# Patient Record
Sex: Male | Born: 1935 | Race: White | Hispanic: No | Marital: Married | Smoking: Never smoker
Health system: Southern US, Community
[De-identification: ages and names within clinical notes are randomized; demographics above are authoritative.]

## PROBLEM LIST (undated history)

## (undated) DIAGNOSIS — N400 Enlarged prostate without lower urinary tract symptoms: Secondary | ICD-10-CM

## (undated) DIAGNOSIS — M1990 Unspecified osteoarthritis, unspecified site: Secondary | ICD-10-CM

## (undated) DIAGNOSIS — K219 Gastro-esophageal reflux disease without esophagitis: Secondary | ICD-10-CM

## (undated) DIAGNOSIS — K5900 Constipation, unspecified: Secondary | ICD-10-CM

## (undated) DIAGNOSIS — F329 Major depressive disorder, single episode, unspecified: Secondary | ICD-10-CM

## (undated) DIAGNOSIS — F32A Depression, unspecified: Secondary | ICD-10-CM

## (undated) DIAGNOSIS — I10 Essential (primary) hypertension: Secondary | ICD-10-CM

## (undated) DIAGNOSIS — Z9109 Other allergy status, other than to drugs and biological substances: Secondary | ICD-10-CM

## (undated) HISTORY — PX: DENTAL SURGERY: SHX609

## (undated) HISTORY — PX: BACK SURGERY: SHX140

## (undated) HISTORY — PX: COLONOSCOPY: SHX174

---

## 2012-07-08 ENCOUNTER — Other Ambulatory Visit: Payer: Self-pay | Admitting: Neurosurgery

## 2012-07-08 DIAGNOSIS — M47817 Spondylosis without myelopathy or radiculopathy, lumbosacral region: Secondary | ICD-10-CM

## 2012-07-08 DIAGNOSIS — M545 Low back pain: Secondary | ICD-10-CM

## 2012-07-08 DIAGNOSIS — M4310 Spondylolisthesis, site unspecified: Secondary | ICD-10-CM

## 2012-07-08 DIAGNOSIS — M5137 Other intervertebral disc degeneration, lumbosacral region: Secondary | ICD-10-CM

## 2012-07-10 ENCOUNTER — Ambulatory Visit
Admission: RE | Admit: 2012-07-10 | Discharge: 2012-07-10 | Disposition: A | Discharge status: Home / Self Care | Payer: Medicare Other | Source: Ambulatory Visit | Attending: Neurosurgery | Admitting: Neurosurgery

## 2012-07-10 DIAGNOSIS — M545 Low back pain: Secondary | ICD-10-CM

## 2012-07-10 DIAGNOSIS — M5137 Other intervertebral disc degeneration, lumbosacral region: Secondary | ICD-10-CM

## 2012-07-10 DIAGNOSIS — M4310 Spondylolisthesis, site unspecified: Secondary | ICD-10-CM

## 2012-07-10 DIAGNOSIS — M47817 Spondylosis without myelopathy or radiculopathy, lumbosacral region: Secondary | ICD-10-CM

## 2012-07-23 ENCOUNTER — Other Ambulatory Visit: Payer: Self-pay | Admitting: Neurosurgery

## 2012-08-24 ENCOUNTER — Encounter (HOSPITAL_COMMUNITY): Payer: Self-pay | Admitting: Pharmacy Technician

## 2012-08-26 ENCOUNTER — Encounter (HOSPITAL_COMMUNITY)
Admission: RE | Admit: 2012-08-26 | Discharge: 2012-08-26 | Disposition: A | Discharge status: Home / Self Care | Payer: Medicare Other | Source: Ambulatory Visit | Attending: Anesthesiology | Admitting: Anesthesiology

## 2012-08-26 ENCOUNTER — Encounter (HOSPITAL_COMMUNITY)
Admission: RE | Admit: 2012-08-26 | Discharge: 2012-08-26 | Disposition: A | Discharge status: Home / Self Care | Payer: Medicare Other | Source: Ambulatory Visit | Attending: Neurosurgery | Admitting: Neurosurgery

## 2012-08-26 ENCOUNTER — Encounter (HOSPITAL_COMMUNITY): Payer: Self-pay

## 2012-08-26 HISTORY — DX: Depression, unspecified: F32.A

## 2012-08-26 HISTORY — DX: Unspecified osteoarthritis, unspecified site: M19.90

## 2012-08-26 HISTORY — DX: Essential (primary) hypertension: I10

## 2012-08-26 HISTORY — DX: Gastro-esophageal reflux disease without esophagitis: K21.9

## 2012-08-26 HISTORY — DX: Major depressive disorder, single episode, unspecified: F32.9

## 2012-08-26 LAB — CBC
HCT: 41.9 % (ref 39.0–52.0)
Hemoglobin: 13.6 g/dL (ref 13.0–17.0)
MCH: 29.8 pg (ref 26.0–34.0)
MCV: 91.7 fL (ref 78.0–100.0)
Platelets: 232 10*3/uL (ref 150–400)
RBC: 4.57 MIL/uL (ref 4.22–5.81)
WBC: 5.9 10*3/uL (ref 4.0–10.5)

## 2012-08-26 LAB — TYPE AND SCREEN
ABO/RH(D): O NEG
Antibody Screen: NEGATIVE

## 2012-08-26 LAB — BASIC METABOLIC PANEL
CO2: 27 mEq/L (ref 19–32)
Calcium: 9.4 mg/dL (ref 8.4–10.5)
Chloride: 102 mEq/L (ref 96–112)
Glucose, Bld: 91 mg/dL (ref 70–99)
Sodium: 139 mEq/L (ref 135–145)

## 2012-08-26 LAB — SURGICAL PCR SCREEN
MRSA, PCR: NEGATIVE
Staphylococcus aureus: NEGATIVE

## 2012-08-26 LAB — ABO/RH: ABO/RH(D): O NEG

## 2012-08-26 NOTE — Progress Notes (Signed)
08/26/12 1456  OBSTRUCTIVE SLEEP APNEA  Have you ever been diagnosed with sleep apnea through a sleep study? No  Do you snore loudly (loud enough to be heard through closed doors)?  1  Do you often feel tired, fatigued, or sleepy during the daytime? 0  Has anyone observed you stop breathing during your sleep? 0  Do you have, or are you being treated for high blood pressure? 1  BMI more than 35 kg/m2? 0  Age over 75 years old? 1  Neck circumference greater than 40 cm/18 inches? 0  Gender: 1  Obstructive Sleep Apnea Score 4   Score 4 or greater  Results sent to PCP

## 2012-08-26 NOTE — Pre-Procedure Instructions (Signed)
22 Calvin Marks  08/26/2012   Your procedure is scheduled on:  Wednesday September 02, 2012  Report to [facility] at 6:30 AM.  Call this number if you have problems the morning of surgery: [phone]   Remember:   Do not eat food or drinkAfter Midnight.    Take these medicines the morning of surgery with A SIP OF WATER: chlortimeton, finasteride, hydrocodone,metoprolol, omeprazole,     Do not wear jewelry, make-up or nail polish.  Do not wear lotions, powders, or perfumes.   Do not shave 48 hours prior to surgery. Men may shave face and neck.  Do not bring valuables to the hospital.  Contacts, dentures or bridgework may not be worn into surgery.  Leave suitcase in the car. After surgery it may be brought to your room.  For patients admitted to the hospital, checkout time is 11:00 AM the day of discharge.   Patients discharged the day of surgery will not be allowed to drive home.  Name and phone number of your driver: family / friend  Special Instructions: Shower using CHG 2 nights before surgery and the night before surgery.  If you shower the day of surgery use CHG.  Use special wash - you have one bottle of CHG for all showers.  You should use approximately 1/3 of the bottle for each shower.   Please read over the following fact sheets that you were given: Pain Booklet, Coughing and Deep Breathing, Blood Transfusion Information, MRSA Information and Surgical Site Infection Prevention

## 2012-08-26 NOTE — Progress Notes (Signed)
Primary Physician -- Dr. Kirby Funk  Does not have a cardiologist No recent cardiac testing

## 2012-08-28 NOTE — Consult Note (Addendum)
Anesthesia Chart Review:  Patient is a 77 year old male scheduled for L2-4 laminectomy with L2-3 and L3-4 PLIF by Dr. Newell Coral on 09/02/12.  History includes non-smoker, HTN, GERD, depression, arthritis.  PCP is listed as Dr. Kirby Funk.  EKG on 08/26/12 showed NSR, cannot rule out anterior infarct (age undetermined).  Currently, there are no comparison EKGs available. No CV symptoms documented at PAT.   CXR on 08/26/12 showed no active disease, degenerative changes of the thoracic spine.  Preoperative labs noted.  I've asked Short Stay staff to request additional records, EKG as available from Dr. Valentina Lucks.  I'll review additional records if received, otherwise clinical correlation on the day of surgery. He has no reported history of CAD/MI/CHF or diabetes mellitus, so would anticipate that if he remains asymptomatic from a CV standpoint then he could proceed as planned. (Update: Only office note available from PCP.  No EKG or prior cardiac studies.)  Shonna Chock, PA-C 09/17/2012 1730

## 2012-09-01 MED ORDER — CEFAZOLIN SODIUM-DEXTROSE 2-3 GM-% IV SOLR
2.0000 g | INTRAVENOUS | Status: AC
  Administered 2012-09-02 (×2): 2 g via INTRAVENOUS
  Filled 2012-09-01: qty 50

## 2012-09-02 ENCOUNTER — Inpatient Hospital Stay (HOSPITAL_COMMUNITY)
Admission: RE | Admit: 2012-09-02 | Discharge: 2012-09-04 | DRG: 460 | Disposition: A | Discharge status: Home / Self Care | Payer: Medicare Other | Source: Ambulatory Visit | Attending: Neurosurgery | Admitting: Neurosurgery

## 2012-09-02 ENCOUNTER — Inpatient Hospital Stay (HOSPITAL_COMMUNITY): Payer: Medicare Other

## 2012-09-02 ENCOUNTER — Encounter (HOSPITAL_COMMUNITY): Payer: Self-pay | Admitting: Vascular Surgery

## 2012-09-02 ENCOUNTER — Encounter (HOSPITAL_COMMUNITY)
Admission: RE | Disposition: A | Discharge status: Home / Self Care | Payer: Self-pay | Source: Ambulatory Visit | Attending: Neurosurgery

## 2012-09-02 ENCOUNTER — Inpatient Hospital Stay (HOSPITAL_COMMUNITY): Payer: Medicare Other | Admitting: Vascular Surgery

## 2012-09-02 ENCOUNTER — Encounter (HOSPITAL_COMMUNITY): Payer: Self-pay | Admitting: *Deleted

## 2012-09-02 DIAGNOSIS — Z79899 Other long term (current) drug therapy: Secondary | ICD-10-CM

## 2012-09-02 DIAGNOSIS — N400 Enlarged prostate without lower urinary tract symptoms: Secondary | ICD-10-CM | POA: Diagnosis present

## 2012-09-02 DIAGNOSIS — M51379 Other intervertebral disc degeneration, lumbosacral region without mention of lumbar back pain or lower extremity pain: Principal | ICD-10-CM | POA: Diagnosis present

## 2012-09-02 DIAGNOSIS — M5137 Other intervertebral disc degeneration, lumbosacral region: Principal | ICD-10-CM | POA: Diagnosis present

## 2012-09-02 DIAGNOSIS — I10 Essential (primary) hypertension: Secondary | ICD-10-CM | POA: Diagnosis present

## 2012-09-02 DIAGNOSIS — K219 Gastro-esophageal reflux disease without esophagitis: Secondary | ICD-10-CM | POA: Diagnosis present

## 2012-09-02 SURGERY — POSTERIOR LUMBAR FUSION 2 LEVEL
Anesthesia: General | Site: Back | Wound class: Clean

## 2012-09-02 MED ORDER — MEPERIDINE HCL 25 MG/ML IJ SOLN: 6.2500 mg | INTRAMUSCULAR | Status: DC | PRN

## 2012-09-02 MED ORDER — MIDAZOLAM HCL 5 MG/5ML IJ SOLN
INTRAMUSCULAR | Status: DC | PRN
  Administered 2012-09-02: 1 mg via INTRAVENOUS

## 2012-09-02 MED ORDER — PROMETHAZINE HCL 25 MG/ML IJ SOLN: 6.2500 mg | INTRAMUSCULAR | Status: DC | PRN

## 2012-09-02 MED ORDER — KETOROLAC TROMETHAMINE 30 MG/ML IJ SOLN: 30.0000 mg | Freq: Once | INTRAMUSCULAR | Status: DC

## 2012-09-02 MED ORDER — LIDOCAINE HCL (CARDIAC) 20 MG/ML IV SOLN
INTRAVENOUS | Status: DC | PRN
  Administered 2012-09-02: 100 mg via INTRAVENOUS

## 2012-09-02 MED ORDER — HYDROXYZINE HCL 25 MG PO TABS: 50.0000 mg | ORAL_TABLET | ORAL | Status: DC | PRN

## 2012-09-02 MED ORDER — LACTATED RINGERS IV SOLN
INTRAVENOUS | Status: DC | PRN
  Administered 2012-09-02 (×4): via INTRAVENOUS

## 2012-09-02 MED ORDER — BUPIVACAINE HCL (PF) 0.5 % IJ SOLN
INTRAMUSCULAR | Status: DC | PRN
  Administered 2012-09-02: 16.5 mL

## 2012-09-02 MED ORDER — HYDROMORPHONE HCL PF 1 MG/ML IJ SOLN
INTRAMUSCULAR | Status: AC
  Filled 2012-09-02: qty 1

## 2012-09-02 MED ORDER — THROMBIN 5000 UNITS EX SOLR
OROMUCOSAL | Status: DC | PRN
  Administered 2012-09-02: 13:00:00 via TOPICAL

## 2012-09-02 MED ORDER — ALUM & MAG HYDROXIDE-SIMETH 200-200-20 MG/5ML PO SUSP
30.0000 mL | Freq: Four times a day (QID) | ORAL | Status: DC | PRN
  Administered 2012-09-03: 30 mL via ORAL
  Filled 2012-09-02: qty 30

## 2012-09-02 MED ORDER — KCL IN DEXTROSE-NACL 20-5-0.45 MEQ/L-%-% IV SOLN
INTRAVENOUS | Status: AC
  Administered 2012-09-02: 1000 mL
  Filled 2012-09-02: qty 1000

## 2012-09-02 MED ORDER — THROMBIN 20000 UNITS EX KIT
PACK | CUTANEOUS | Status: DC | PRN
  Administered 2012-09-02 (×2): 20000 [IU] via TOPICAL

## 2012-09-02 MED ORDER — SODIUM CHLORIDE 0.9 % IV SOLN
INTRAVENOUS | Status: DC | PRN
  Administered 2012-09-02: 12:00:00 via INTRAVENOUS

## 2012-09-02 MED ORDER — OXYCODONE HCL 5 MG PO TABS
5.0000 mg | ORAL_TABLET | ORAL | Status: DC | PRN
  Administered 2012-09-02 – 2012-09-04 (×4): 10 mg via ORAL
  Filled 2012-09-02 (×5): qty 2

## 2012-09-02 MED ORDER — GLYCOPYRROLATE 0.2 MG/ML IJ SOLN
INTRAMUSCULAR | Status: DC | PRN
  Administered 2012-09-02: .8 mg via INTRAVENOUS

## 2012-09-02 MED ORDER — ALBUMIN HUMAN 5 % IV SOLN
INTRAVENOUS | Status: DC | PRN
  Administered 2012-09-02 (×2): via INTRAVENOUS

## 2012-09-02 MED ORDER — SODIUM CHLORIDE 0.9 % IJ SOLN
3.0000 mL | Freq: Two times a day (BID) | INTRAMUSCULAR | Status: DC
  Administered 2012-09-02 – 2012-09-03 (×3): 3 mL via INTRAVENOUS

## 2012-09-02 MED ORDER — FINASTERIDE 5 MG PO TABS
5.0000 mg | ORAL_TABLET | Freq: Every morning | ORAL | Status: DC
  Administered 2012-09-03 – 2012-09-04 (×2): 5 mg via ORAL
  Filled 2012-09-02 (×3): qty 1

## 2012-09-02 MED ORDER — DIAZEPAM 5 MG PO TABS
10.0000 mg | ORAL_TABLET | Freq: Every day | ORAL | Status: DC
  Administered 2012-09-02 – 2012-09-03 (×2): 10 mg via ORAL
  Filled 2012-09-02 (×2): qty 2

## 2012-09-02 MED ORDER — TRIAMTERENE-HCTZ 37.5-25 MG PO TABS
1.0000 | ORAL_TABLET | Freq: Every morning | ORAL | Status: DC
  Administered 2012-09-03 – 2012-09-04 (×2): 1 via ORAL
  Filled 2012-09-02 (×3): qty 1

## 2012-09-02 MED ORDER — PROPOFOL 10 MG/ML IV BOLUS
INTRAVENOUS | Status: DC | PRN
  Administered 2012-09-02: 160 mg via INTRAVENOUS

## 2012-09-02 MED ORDER — BACITRACIN 50000 UNITS IM SOLR
INTRAMUSCULAR | Status: AC
  Filled 2012-09-02: qty 1

## 2012-09-02 MED ORDER — ARTIFICIAL TEARS OP OINT
TOPICAL_OINTMENT | OPHTHALMIC | Status: DC | PRN
  Administered 2012-09-02: 1 via OPHTHALMIC

## 2012-09-02 MED ORDER — OXYCODONE HCL 5 MG/5ML PO SOLN: 5.0000 mg | Freq: Once | ORAL | Status: DC | PRN

## 2012-09-02 MED ORDER — MENTHOL 3 MG MT LOZG
1.0000 | LOZENGE | OROMUCOSAL | Status: DC | PRN
  Filled 2012-09-02: qty 9

## 2012-09-02 MED ORDER — VECURONIUM BROMIDE 10 MG IV SOLR
INTRAVENOUS | Status: DC | PRN
  Administered 2012-09-02: 1 mg via INTRAVENOUS
  Administered 2012-09-02 (×3): 2 mg via INTRAVENOUS
  Administered 2012-09-02: 1 mg via INTRAVENOUS

## 2012-09-02 MED ORDER — PHENOL 1.4 % MT LIQD: 1.0000 | OROMUCOSAL | Status: DC | PRN

## 2012-09-02 MED ORDER — MORPHINE SULFATE 4 MG/ML IJ SOLN
4.0000 mg | INTRAMUSCULAR | Status: DC | PRN
  Administered 2012-09-02: 4 mg via INTRAMUSCULAR
  Filled 2012-09-02: qty 1

## 2012-09-02 MED ORDER — OXYCODONE HCL 5 MG PO TABS: 5.0000 mg | ORAL_TABLET | Freq: Once | ORAL | Status: DC | PRN

## 2012-09-02 MED ORDER — ACETAMINOPHEN 325 MG PO TABS
650.0000 mg | ORAL_TABLET | ORAL | Status: DC | PRN
  Administered 2012-09-04 (×2): 650 mg via ORAL
  Filled 2012-09-02 (×2): qty 2

## 2012-09-02 MED ORDER — ACETAMINOPHEN 650 MG RE SUPP: 650.0000 mg | RECTAL | Status: DC | PRN

## 2012-09-02 MED ORDER — LIDOCAINE-EPINEPHRINE 1 %-1:100000 IJ SOLN
INTRAMUSCULAR | Status: DC | PRN
  Administered 2012-09-02: 16.5 mL via INTRADERMAL

## 2012-09-02 MED ORDER — KETOROLAC TROMETHAMINE 30 MG/ML IJ SOLN
INTRAMUSCULAR | Status: AC
  Administered 2012-09-02: 30 mg
  Filled 2012-09-02: qty 1

## 2012-09-02 MED ORDER — BISACODYL 10 MG RE SUPP: 10.0000 mg | Freq: Every day | RECTAL | Status: DC | PRN

## 2012-09-02 MED ORDER — CEFAZOLIN SODIUM-DEXTROSE 2-3 GM-% IV SOLR
INTRAVENOUS | Status: AC
  Filled 2012-09-02: qty 50

## 2012-09-02 MED ORDER — ACETAMINOPHEN 10 MG/ML IV SOLN
1000.0000 mg | Freq: Four times a day (QID) | INTRAVENOUS | Status: AC
  Administered 2012-09-02 – 2012-09-03 (×4): 1000 mg via INTRAVENOUS
  Filled 2012-09-02 (×4): qty 100

## 2012-09-02 MED ORDER — POLYETHYLENE GLYCOL 3350 17 G PO PACK
9.0000 g | PACK | Freq: Every morning | ORAL | Status: DC
  Administered 2012-09-03 – 2012-09-04 (×2): 9 g via ORAL
  Filled 2012-09-02 (×3): qty 1

## 2012-09-02 MED ORDER — 0.9 % SODIUM CHLORIDE (POUR BTL) OPTIME
TOPICAL | Status: DC | PRN
  Administered 2012-09-02 (×2): 1000 mL

## 2012-09-02 MED ORDER — ACETAMINOPHEN 10 MG/ML IV SOLN
INTRAVENOUS | Status: AC
  Administered 2012-09-02: 1000 mg via INTRAVENOUS
  Filled 2012-09-02: qty 100

## 2012-09-02 MED ORDER — SODIUM CHLORIDE 0.9 % IJ SOLN: 3.0000 mL | INTRAMUSCULAR | Status: DC | PRN

## 2012-09-02 MED ORDER — ROCURONIUM BROMIDE 100 MG/10ML IV SOLN
INTRAVENOUS | Status: DC | PRN
  Administered 2012-09-02: 50 mg via INTRAVENOUS

## 2012-09-02 MED ORDER — ONDANSETRON HCL 4 MG/2ML IJ SOLN
INTRAMUSCULAR | Status: DC | PRN
  Administered 2012-09-02: 4 mg via INTRAVENOUS

## 2012-09-02 MED ORDER — HYDROMORPHONE HCL PF 1 MG/ML IJ SOLN
0.2500 mg | INTRAMUSCULAR | Status: DC | PRN
  Administered 2012-09-02 (×3): 0.5 mg via INTRAVENOUS

## 2012-09-02 MED ORDER — SODIUM CHLORIDE 0.9 % IR SOLN
Status: DC | PRN
  Administered 2012-09-02 (×2)

## 2012-09-02 MED ORDER — KCL IN DEXTROSE-NACL 20-5-0.45 MEQ/L-%-% IV SOLN
INTRAVENOUS | Status: DC
  Filled 2012-09-02 (×6): qty 1000

## 2012-09-02 MED ORDER — SODIUM CHLORIDE 0.9 % IV SOLN
INTRAVENOUS | Status: AC
  Filled 2012-09-02: qty 500

## 2012-09-02 MED ORDER — MAGNESIUM HYDROXIDE 400 MG/5ML PO SUSP: 30.0000 mL | Freq: Every day | ORAL | Status: DC | PRN

## 2012-09-02 MED ORDER — SIMVASTATIN 20 MG PO TABS
20.0000 mg | ORAL_TABLET | Freq: Every day | ORAL | Status: DC
  Administered 2012-09-02 – 2012-09-03 (×2): 20 mg via ORAL
  Filled 2012-09-02 (×3): qty 1

## 2012-09-02 MED ORDER — EPHEDRINE SULFATE 50 MG/ML IJ SOLN
INTRAMUSCULAR | Status: DC | PRN
  Administered 2012-09-02 (×2): 10 mg via INTRAVENOUS

## 2012-09-02 MED ORDER — HEMOSTATIC AGENTS (NO CHARGE) OPTIME
TOPICAL | Status: DC | PRN
  Administered 2012-09-02 (×2): 1 via TOPICAL

## 2012-09-02 MED ORDER — KETOROLAC TROMETHAMINE 30 MG/ML IJ SOLN
30.0000 mg | Freq: Four times a day (QID) | INTRAMUSCULAR | Status: DC
  Administered 2012-09-02 – 2012-09-04 (×7): 30 mg via INTRAVENOUS
  Filled 2012-09-02 (×10): qty 1

## 2012-09-02 MED ORDER — SODIUM CHLORIDE 0.9 % IV SOLN: 250.0000 mL | INTRAVENOUS | Status: DC

## 2012-09-02 MED ORDER — FENTANYL CITRATE 0.05 MG/ML IJ SOLN
INTRAMUSCULAR | Status: DC | PRN
  Administered 2012-09-02: 50 ug via INTRAVENOUS
  Administered 2012-09-02: 100 ug via INTRAVENOUS
  Administered 2012-09-02 (×3): 50 ug via INTRAVENOUS

## 2012-09-02 MED ORDER — METOPROLOL TARTRATE 25 MG PO TABS
25.0000 mg | ORAL_TABLET | Freq: Every morning | ORAL | Status: DC
  Administered 2012-09-03 – 2012-09-04 (×2): 25 mg via ORAL
  Filled 2012-09-02 (×3): qty 1

## 2012-09-02 MED ORDER — PANTOPRAZOLE SODIUM 40 MG PO TBEC
40.0000 mg | DELAYED_RELEASE_TABLET | Freq: Every day | ORAL | Status: DC
  Administered 2012-09-03 – 2012-09-04 (×2): 40 mg via ORAL
  Filled 2012-09-02 (×2): qty 1

## 2012-09-02 MED ORDER — CYCLOBENZAPRINE HCL 10 MG PO TABS
10.0000 mg | ORAL_TABLET | Freq: Three times a day (TID) | ORAL | Status: DC | PRN
  Administered 2012-09-02 – 2012-09-03 (×2): 10 mg via ORAL
  Filled 2012-09-02 (×2): qty 1

## 2012-09-02 MED ORDER — NEOSTIGMINE METHYLSULFATE 1 MG/ML IJ SOLN
INTRAMUSCULAR | Status: DC | PRN
  Administered 2012-09-02: 5 mg via INTRAVENOUS

## 2012-09-02 MED ORDER — HYDROXYZINE HCL 50 MG/ML IM SOLN: 50.0000 mg | INTRAMUSCULAR | Status: DC | PRN

## 2012-09-02 SURGICAL SUPPLY — 79 items
BAG DECANTER FOR FLEXI CONT (MISCELLANEOUS) ×2 IMPLANT
BENZOIN TINCTURE PRP APPL 2/3 (GAUZE/BANDAGES/DRESSINGS) IMPLANT
BLADE SURG ROTATE 9660 (MISCELLANEOUS) IMPLANT
BRUSH SCRUB EZ PLAIN DRY (MISCELLANEOUS) ×2 IMPLANT
BUR ACORN 6.0 ACORN (BURR) IMPLANT
BUR ACRON 5.0MM COATED (BURR) ×4 IMPLANT
BUR MATCHSTICK NEURO 3.0 LAGG (BURR) ×2 IMPLANT
CANISTER SUCTION 2500CC (MISCELLANEOUS) ×2 IMPLANT
CAP LCK SPNE (Orthopedic Implant) ×6 IMPLANT
CAP LOCK SPINE RADIUS (Orthopedic Implant) ×6 IMPLANT
CAP LOCKING (Orthopedic Implant) ×6 IMPLANT
CATH COUDE FOLEY 2W 5CC 16FR (CATHETERS) ×2 IMPLANT
CLOTH BEACON ORANGE TIMEOUT ST (SAFETY) ×2 IMPLANT
CONT SPEC 4OZ CLIKSEAL STRL BL (MISCELLANEOUS) ×4 IMPLANT
COVER BACK TABLE 24X17X13 BIG (DRAPES) IMPLANT
COVER TABLE BACK 60X90 (DRAPES) ×2 IMPLANT
CROSSLINK MEDIUM (Orthopedic Implant) ×2 IMPLANT
DERMABOND ADVANCED (GAUZE/BANDAGES/DRESSINGS) ×1
DERMABOND ADVANCED .7 DNX12 (GAUZE/BANDAGES/DRESSINGS) ×1 IMPLANT
DRAPE C-ARM 42X72 X-RAY (DRAPES) ×4 IMPLANT
DRAPE LAPAROTOMY 100X72X124 (DRAPES) ×2 IMPLANT
DRAPE POUCH INSTRU U-SHP 10X18 (DRAPES) ×2 IMPLANT
DRAPE PROXIMA HALF (DRAPES) ×2 IMPLANT
DRSG EMULSION OIL 3X3 NADH (GAUZE/BANDAGES/DRESSINGS) IMPLANT
ELECT REM PT RETURN 9FT ADLT (ELECTROSURGICAL) ×2
ELECTRODE REM PT RTRN 9FT ADLT (ELECTROSURGICAL) ×1 IMPLANT
GAUZE SPONGE 4X4 16PLY XRAY LF (GAUZE/BANDAGES/DRESSINGS) ×2 IMPLANT
GLOVE BIOGEL PI IND STRL 8 (GLOVE) ×2 IMPLANT
GLOVE BIOGEL PI IND STRL 8.5 (GLOVE) ×2 IMPLANT
GLOVE BIOGEL PI INDICATOR 8 (GLOVE) ×2
GLOVE BIOGEL PI INDICATOR 8.5 (GLOVE) ×2
GLOVE ECLIPSE 6.5 STRL STRAW (GLOVE) ×2 IMPLANT
GLOVE ECLIPSE 7.5 STRL STRAW (GLOVE) ×4 IMPLANT
GLOVE EXAM NITRILE LRG STRL (GLOVE) ×2 IMPLANT
GLOVE EXAM NITRILE MD LF STRL (GLOVE) ×6 IMPLANT
GLOVE EXAM NITRILE XL STR (GLOVE) IMPLANT
GLOVE EXAM NITRILE XS STR PU (GLOVE) IMPLANT
GLOVE SURG SS PI 8.0 STRL IVOR (GLOVE) ×6 IMPLANT
GOWN BRE IMP SLV AUR LG STRL (GOWN DISPOSABLE) ×2 IMPLANT
GOWN BRE IMP SLV AUR XL STRL (GOWN DISPOSABLE) ×4 IMPLANT
GOWN STRL REIN 2XL LVL4 (GOWN DISPOSABLE) ×4 IMPLANT
HEMOSTAT POWDER SURGIFOAM 1G (HEMOSTASIS) ×2 IMPLANT
KIT BASIN OR (CUSTOM PROCEDURE TRAY) ×2 IMPLANT
KIT INFUSE MEDIUM (Orthopedic Implant) ×2 IMPLANT
KIT ROOM TURNOVER OR (KITS) ×2 IMPLANT
MILL MEDIUM DISP (BLADE) ×2 IMPLANT
NEEDLE 18GX1X1/2 (RX/OR ONLY) (NEEDLE) ×2 IMPLANT
NEEDLE BONE MARROW 8GAX6 (NEEDLE) ×2 IMPLANT
NEEDLE SPNL 18GX3.5 QUINCKE PK (NEEDLE) ×4 IMPLANT
NEEDLE SPNL 22GX3.5 QUINCKE BK (NEEDLE) ×2 IMPLANT
NS IRRIG 1000ML POUR BTL (IV SOLUTION) ×4 IMPLANT
PACK LAMINECTOMY NEURO (CUSTOM PROCEDURE TRAY) ×2 IMPLANT
PAD ARMBOARD 7.5X6 YLW CONV (MISCELLANEOUS) ×6 IMPLANT
PATTIES SURGICAL .5 X.5 (GAUZE/BANDAGES/DRESSINGS) ×2 IMPLANT
PATTIES SURGICAL .5 X1 (DISPOSABLE) IMPLANT
PATTIES SURGICAL 1X1 (DISPOSABLE) ×2 IMPLANT
PEEK PLIF AVS 8X25X4 DEGREE (Peek) ×4 IMPLANT
PEEK PLIF AVS 9X25X4 (Peek) ×4 IMPLANT
ROD 5.5X60MM GREEN (Rod) ×4 IMPLANT
SCREW 5.75X45MM (Screw) ×12 IMPLANT
SPONGE GAUZE 4X4 12PLY (GAUZE/BANDAGES/DRESSINGS) ×2 IMPLANT
SPONGE LAP 4X18 X RAY DECT (DISPOSABLE) IMPLANT
SPONGE NEURO XRAY DETECT 1X3 (DISPOSABLE) ×2 IMPLANT
SPONGE SURGIFOAM ABS GEL 100 (HEMOSTASIS) ×4 IMPLANT
STAPLER SKIN PROX WIDE 3.9 (STAPLE) IMPLANT
STRIP BIOACTIVE VITOSS 25X100X (Neuro Prosthesis/Implant) ×4 IMPLANT
STRIP CLOSURE SKIN 1/2X4 (GAUZE/BANDAGES/DRESSINGS) IMPLANT
SUT PROLENE 6 0 BV (SUTURE) IMPLANT
SUT VIC AB 1 CT1 18XBRD ANBCTR (SUTURE) ×3 IMPLANT
SUT VIC AB 1 CT1 8-18 (SUTURE) ×3
SUT VIC AB 2-0 CP2 18 (SUTURE) ×4 IMPLANT
SYR 20CC LL (SYRINGE) ×2 IMPLANT
SYR 5ML LL (SYRINGE) IMPLANT
SYR CONTROL 10ML LL (SYRINGE) ×2 IMPLANT
TOWEL OR 17X24 6PK STRL BLUE (TOWEL DISPOSABLE) ×2 IMPLANT
TOWEL OR 17X26 10 PK STRL BLUE (TOWEL DISPOSABLE) ×2 IMPLANT
TRAP SPECIMEN MUCOUS 40CC (MISCELLANEOUS) ×2 IMPLANT
TRAY FOLEY CATH 14FRSI W/METER (CATHETERS) ×2 IMPLANT
WATER STERILE IRR 1000ML POUR (IV SOLUTION) ×2 IMPLANT

## 2012-09-02 NOTE — Anesthesia Postprocedure Evaluation (Signed)
  Anesthesia Post-op Note  Patient: Calvin Marks  Procedure(s) Performed: Procedure(s) (LRB) with comments: POSTERIOR LUMBAR FUSION 2 LEVEL (N/A) - Lumbar Two, Three, and Four. Laminectomy with Lumbar Two-Three and Lumbar Three-Four Posterior Lumbar interbody fusion with interbody prothesis posterolateral arthrodesis and posterior nonsegmental instrumentation  Patient Location: PACU  Anesthesia Type:General  Level of Consciousness: awake  Airway and Oxygen Therapy: Patient Spontanous Breathing  Post-op Pain: moderate  Post-op Assessment: Post-op Vital signs reviewed  Post-op Vital Signs: stable  Complications: No apparent anesthesia complications

## 2012-09-02 NOTE — Transfer of Care (Signed)
Immediate Anesthesia Transfer of Care Note  Patient: Calvin Marks  Procedure(s) Performed: Procedure(s) (LRB) with comments: POSTERIOR LUMBAR FUSION 2 LEVEL (N/A) - Lumbar Two, Three, and Four. Laminectomy with Lumbar Two-Three and Lumbar Three-Four Posterior Lumbar interbody fusion with interbody prothesis posterolateral arthrodesis and posterior nonsegmental instrumentation  Patient Location: PACU  Anesthesia Type:General  Level of Consciousness: awake, alert , oriented and sedated  Airway & Oxygen Therapy: Patient Spontanous Breathing and Patient connected to nasal cannula oxygen  Post-op Assessment: Report given to PACU RN, Post -op Vital signs reviewed and stable and Patient moving all extremities   Post vital signs: Reviewed and stable  Complications: No apparent anesthesia complications

## 2012-09-02 NOTE — Preoperative (Signed)
Beta Blockers   Reason not to administer Beta Blockers:Not Applicable 

## 2012-09-02 NOTE — Progress Notes (Signed)
Filed Vitals:   09/02/12 1522 09/02/12 1524 09/02/12 1527 09/02/12 1555  BP:   98/57 124/70  Pulse: 76 80 62 77  Temp:   97.8 F (36.6 C) 97.7 F (36.5 C)  TempSrc:      Resp: 14 13 13 16   SpO2: 99% 98% 85% 96%    Patient resting in bed, mild incisional discomfort. Moving all 4 extremities. Dressing clean and dry. Foley to straight drainage.  Plan: Encouraged to ambulate in the halls. Will leave Foley in until tomorrow because of difficulty placing Foley, and history of BPH.  Hewitt Shorts, MD 09/02/2012, 5:31 PM

## 2012-09-02 NOTE — OR Nursing (Signed)
After beginning to insert a 14 fr foley catheter it would not advance to the hub. It was removed and Dr. Newell Coral tried with a 14 fr. After no success, a 16 coude was tried by surgeon and was successful, minute amount of blood seen at tip of meatus. Surgeon says do not remove until he orders.

## 2012-09-02 NOTE — Progress Notes (Signed)
Pt. Sleeping, will wake up and state he is hurting..then falls back to sleep.

## 2012-09-02 NOTE — Op Note (Signed)
09/02/2012  2:33 PM  PATIENT:  Calvin Marks  77 y.o. male  PRE-OPERATIVE DIAGNOSIS:  lumbar stenosis, lumbar spondylolisthesis, lumbar spondylosis, lumbar degenerative disc disease  POST-OPERATIVE DIAGNOSIS:  lumbar stenosis, lumbar spondylolisthesis, lumbar spondylosis, lumbar degenerative  disc disease  PROCEDURE:  Procedure(s): POSTERIOR LUMBAR FUSION 3 LEVEL:  L2-L4 decompression including L2-L4 laminectomy, bilateral L2-3 and L3-4 facetectomy and foraminotomies, with decompression of the stenotic compression of the exiting L2, L3, and L4 nerve roots, with decompression beyond that required for interbody arthrodesis, with microdissection and microsurgical technique, bilateral L2-3 and L3-4 posterior lumbar interbody arthrodesis with AVS peek interbody implants Vitoss BA with bone marrow aspirate, infuse, and locally harvested morcellized autograft, and bilateral L2-L4 posterior lumbar arthrodesis with radius posterior instrumentation, infuse, and Vitoss BA with bone marrow aspirate   SURGEON:  Surgeon(s): Hewitt Shorts, MD Carmela Hurt, MD  ASSISTANTS:  Coletta Memos, MD  ANESTHESIA:   general  EBL:  Total I/O In: 4630 [I.V.:3800; Blood:330; IV Piggyback:500] Out: 1220 [Urine:320; Blood:900]  BLOOD ADMINISTERED:330 CC CELLSAVER  COUNT: Correct per nursing staff  DICTATION: Patient was brought to the operating room placed under general endotracheal anesthesia. The patient was turned to prone position, the lumbar region was prepped with Betadine soap and solution and draped in a sterile fashion. The midline was infiltrated with local anesthesia with epinephrine. A localizing x-ray was taken and then a midline incision was made and carried down through the subcutaneous tissue, bipolar cautery and electrocautery were used to maintain hemostasis. Dissection was carried down to the lumbar fascia. The fascia was incised bilaterally and the paraspinal muscles were dissected with a  spinous process and lamina in a subperiosteal fashion. Another x-ray was taken for localization and the L2, L3, and L4  levels was localized. Dissection was then carried out laterally over the facet complexes and the transverse processes of L2, L3, and L4  were exposed and decorticated. Decompression was performed using the high-speed drill and Kerrison punches. The facet complexes were markedly hypertrophic bilaterally at the L2-3 and L3-4 levels.   Decompression  was carried out laterally including facetectomy and foraminotomies with decompression of the stenotic compression of the L2, L3, and L4 exiting  nerve roots. Once the decompression of the stenotic compression of the thecal sac and exiting nerve roots was completed we proceeded with the posterior lumbar interbody arthrodesis. The annulus at each level was incised bilaterally and the disc space entered. A thorough discectomy was performed using pituitary rongeurs and curettes. Once the discectomy was completed we began to prepare the endplate surfaces, removing the cartilaginous endplates surface with paddle curettes. We then measured the height of the intervertebral disc space. We selected 8 x 25 x 4  AVS peek interbody implants for the L2-3  level, and 9 x 25 x 4  AVS peek interbody implants for the L3-4  level.  The C-arm fluoroscope was then draped and brought in the field and we identified the pedicle entry points bilaterally at the L2, L3, and L4  levels. Each of the 6 pedicles was probed, we aspirated bone marrow aspirate from the vertebral bodies, this was injected over 2 10 cc strip of Vitoss BA. Then each of the pedicles was examined with the ball probe, good bony surfaces were found and no bony cuts were found. Each of the pedicles was then tapped with a 5.25 mm tap, again examined with the ball probe good threading was found and no bony cuts were found. We then placed  5.75 by 45  millimeter screws bilaterally at the L2  level, 5.75 by 45   millimeter screws bilaterally at the L3  level, and 5.75 by 45  millimeter screws bilaterally at the L4  level.  We then packed the AVS peek interbody implants with Vitoss BA with bone marrow aspirate and infuse, and then placed the first implant at the L2-3  level on the right side, carefully retracting the thecal sac and nerve root medially. We then went back to the left side and packed the midline with  locally harvested morcellized autograft and infuse, and then placed a second implant on the left side again retracting the thecal sac and nerve root medially. Additional Vitoss BA with bone marrow aspirate was packed lateral to the implants.  Then at the L3-4  level, we placed the first implant on the right side, carefully retracting the thecal sac and nerve root medially. We then went back to the left side and packed the midline with  locally harvested morcellized autograft and infuse,  and then placed a second implant on the left side, again retracting the thecal sac and nerve root medially. Additional Vitoss BA with bone marrow aspirate was packed lateral to the implants.   We then packed the lateral gutter over the transverse processes and intertransverse space with Vitoss BA with bone marrow aspirate. We then selected 60 mm pre-lordosed rods, they were placed within the screw heads and secured with locking caps once all 6 locking caps were placed final tightening was performed against a counter torque.A variable medium cross connector was placed between the screws at the L3-4 level, secured to the rods, and the central locking collar was tightened down.  The wound had been irrigated multiple times during the procedure with saline solution and bacitracin solution, good hemostasis was established with a combination of bipolar cautery, Gelfoam with thrombin, and Surgifoam. Once good hemostasis was confirmed we proceeded with closure paraspinal muscles deep fascia and Scarpa's fascia were closed with  interrupted undyed 1 Vicryl sutures the subcutaneous and subcuticular closed with interrupted inverted 2-0 undyed Vicryl sutures the skin edges were approximated with Dermabond.The wound was dressed with sterile gauze and Hypafix.  Following surgery the patient was turned back to the supine position to be reversed and the anesthetic extubated and transferred to the recovery room for further care.    PLAN OF CARE: Admit to inpatient   PATIENT DISPOSITION:  PACU - hemodynamically stable.   Delay start of Pharmacological VTE agent (>24hrs) due to surgical blood loss or risk of bleeding:  yes

## 2012-09-02 NOTE — Anesthesia Preprocedure Evaluation (Signed)
Anesthesia Evaluation  Patient identified by MRN, date of birth, ID band Patient awake    Reviewed: Allergy & Precautions, H&P , NPO status , Patient's Chart, lab work & pertinent test results  History of Anesthesia Complications Negative for: history of anesthetic complications  Airway Mallampati: II  Neck ROM: Full    Dental  (+) Teeth Intact and Poor Dentition   Pulmonary neg pulmonary ROS,  breath sounds clear to auscultation        Cardiovascular hypertension, Rhythm:Regular Rate:Normal     Neuro/Psych negative neurological ROS     GI/Hepatic Neg liver ROS, GERD-  ,  Endo/Other  negative endocrine ROS  Renal/GU negative Renal ROS     Musculoskeletal negative musculoskeletal ROS (+)   Abdominal   Peds  Hematology negative hematology ROS (+)   Anesthesia Other Findings   Reproductive/Obstetrics                           Anesthesia Physical Anesthesia Plan  ASA: II  Anesthesia Plan: General   Post-op Pain Management:    Induction: Intravenous  Airway Management Planned: Oral ETT  Additional Equipment:   Intra-op Plan:   Post-operative Plan: Extubation in OR  Informed Consent: I have reviewed the patients History and Physical, chart, labs and discussed the procedure including the risks, benefits and alternatives for the proposed anesthesia with the patient or authorized representative who has indicated his/her understanding and acceptance.   Dental advisory given  Plan Discussed with: Surgeon and CRNA  Anesthesia Plan Comments:         Anesthesia Quick Evaluation

## 2012-09-02 NOTE — H&P (Signed)
Subjective: Patient is a 77 y.o. male who is admitted for treatment of a multilevel multifactorial lumbar stenosis, associated with a degenerative dynamic spondylolisthesis. Patient has had difficulties with his back off and on over the years worsened about 8 months ago with increasing pain across his back, down into the buttocks, into the thighs worse to the left than the right. He's been treated with anti-inflammatory medication and narcotic analgesics. He is admitted for an L2 to L4 decompressive lumbar laminectomy, L2-3 and L3-4 posterior lumbar interbody arthrodesis and it L2-L4 posterolateral arthrodesis.   Past Medical History  Diagnosis Date  . Hypertension   . Depression   . GERD (gastroesophageal reflux disease)   . Arthritis     Past Surgical History  Procedure Date  . Colonoscopy   . Dental surgery     Prescriptions prior to admission  Medication Sig Dispense Refill  . chlorpheniramine (CHLOR-TRIMETON) 4 MG tablet Take 4 mg by mouth 2 (two) times daily as needed. For allergies      . diazepam (VALIUM) 10 MG tablet Take 10 mg by mouth at bedtime.      . finasteride (PROSCAR) 5 MG tablet Take 5 mg by mouth every morning.      . fish oil-omega-3 fatty acids 1000 MG capsule Take 1 g by mouth every morning.      [name] Kitchen HYDROcodone-acetaminophen (LORTAB) 7.5-500 MG per tablet Take 1 tablet by mouth every 4 (four) hours as needed. For pain      . Melatonin 10 MG CAPS Take 1 tablet by mouth at bedtime as needed. For sleep      . meloxicam (MOBIC) 15 MG tablet Take 15 mg by mouth daily.      . metoprolol tartrate (LOPRESSOR) 25 MG tablet Take 25 mg by mouth every morning.      [name] Kitchen omeprazole (PRILOSEC) 20 MG capsule Take 20 mg by mouth every morning.      . polyethylene glycol (MIRALAX / GLYCOLAX) packet Take 9 g by mouth every morning.      . pravastatin (PRAVACHOL) 40 MG tablet Take 40 mg by mouth every morning.      . triamterene-hydrochlorothiazide (MAXZIDE-25) 37.5-25 MG per tablet Take  1 tablet by mouth every morning.       Allergies  Allergen Reactions  . Ciprofloxacin Hives    History  Substance Use Topics  . Smoking status: Never Smoker   . Smokeless tobacco: Not on file  . Alcohol Use: Yes     Comment: socially    History reviewed. No pertinent family history.   Review of Systems A comprehensive review of systems was negative.  Objective: Vital signs in last 24 hours: Temp:  [97.7 F (36.5 C)] 97.7 F (36.5 C) (01/15 0643) Pulse Rate:  [58] 58  (01/15 0643) Resp:  [18] 18  (01/15 0643) BP: (168)/(89) 168/89 mmHg (01/15 0643) SpO2:  [99 %] 99 % (01/15 0643)  EXAM: Patient is a well-developed well-nourished white male in no acute distress. Lungs are clear to auscultation , the patient has symmetrical respiratory excursion. Heart has a regular rate and rhythm normal S1 and S2 no murmur.   Abdomen is soft nontender nondistended bowel sounds are present. Extremity examination shows no clubbing cyanosis or edema. Musculoskeletal examination shows no tenderness to palpation over the lumbar spinous processes or paralumbar musculature. He stands with a slightly flexed forward posture, but is able to flex to beyond 90. However he is not able to extend beyond 0. Straight leg  raising is negative bilaterally. Motor examination shows 5 over 5 strength in the lower extremities including the iliopsoas quadriceps dorsiflexor extensor hallicus  longus and plantar flexor bilaterally. Sensation is intact to pinprick in the distal lower extremities. Reflexes are symmetrical bilaterally. No pathologic reflexes are present. Patient has a normal gait and stance.   Data Review:CBC    Component Value Date/Time   WBC 5.9 08/26/2012 1515   RBC 4.57 08/26/2012 1515   HGB 13.6 08/26/2012 1515   HCT 41.9 08/26/2012 1515   PLT 232 08/26/2012 1515   MCV 91.7 08/26/2012 1515   MCH 29.8 08/26/2012 1515   MCHC 32.5 08/26/2012 1515   RDW 13.5 08/26/2012 1515                          BMET      Component Value Date/Time   NA 139 08/26/2012 1515   K 4.2 08/26/2012 1515   CL 102 08/26/2012 1515   CO2 27 08/26/2012 1515   GLUCOSE 91 08/26/2012 1515   BUN 19 08/26/2012 1515   CREATININE 1.09 08/26/2012 1515   CALCIUM 9.4 08/26/2012 1515   GFRNONAA 65* 08/26/2012 1515   GFRAA 75* 08/26/2012 1515     Assessment/Plan: Patient with extensive lumbar degenerative disc disease and spondylosis, with resulting lumbar stenosis severe at the L2-3 level, marked at the L3-4 level, and moderate at the L1-2 and L4-5 levels. He has a dynamic degenerative spondylolisthesis at L3-4. He is admitted now for an L2-L4 decompressive lumbar laminectomy, L2-3 and L3-4 PLIF, and L2-L4 PLA.  I've discussed with the patient the nature of his condition, the nature the surgical procedure, the typical length of surgery, hospital stay, and overall recuperation, the limitations postoperatively, and risks of surgery. I discussed risks including risks of infection, bleeding, possibly need for transfusion, the risk of nerve root dysfunction with pain, weakness, numbness, or paresthesias, the risk of dural tear and CSF leakage and possible need for further surgery, the risk of failure of the arthrodesis and possibly for further surgery, the risk of anesthetic complications including myocardial infarction, stroke, pneumonia, and death. We discussed the need for postoperative immobilization in a lumbar brace. Understanding all this the patient does wish to proceed with surgery and is admitted for such.     Hewitt Shorts, MD 09/02/2012 7:54 AM

## 2012-09-03 MED ORDER — TAMSULOSIN HCL 0.4 MG PO CAPS
0.8000 mg | ORAL_CAPSULE | ORAL | Status: AC
  Administered 2012-09-03: 0.8 mg via ORAL
  Filled 2012-09-03: qty 2

## 2012-09-03 MED ORDER — TAMSULOSIN HCL 0.4 MG PO CAPS
0.4000 mg | ORAL_CAPSULE | Freq: Every day | ORAL | Status: DC
  Administered 2012-09-04: 0.4 mg via ORAL
  Filled 2012-09-03 (×2): qty 1

## 2012-09-03 MED FILL — Sodium Chloride Irrigation Soln 0.9%: Qty: 3000 | Status: AC

## 2012-09-03 MED FILL — Heparin Sodium (Porcine) Inj 1000 Unit/ML: INTRAMUSCULAR | Qty: 30 | Status: AC

## 2012-09-03 MED FILL — Sodium Chloride IV Soln 0.9%: INTRAVENOUS | Qty: 1000 | Status: AC

## 2012-09-03 NOTE — Progress Notes (Signed)
Filed Vitals:   09/02/12 1555 09/02/12 1935 09/03/12 0000 09/03/12 0430  BP: 124/70 156/76 146/67 153/65  Pulse: 77 78 81 88  Temp: 97.7 F (36.5 C) 97.5 F (36.4 C) 98.1 F (36.7 C) 98.4 F (36.9 C)  TempSrc:  Oral Oral Oral  Resp: 16 18 16 16   SpO2: 96% 95% 96% 98%     Patient's fairly comfortable. Has been up and ambulating. Dressing clean and dry. We'll plan on discontinuing Foley, this morning, after he has had his first walk. We'll add Flomax.  Plan: Ambulating in halls. Discontinue Foley this morning, and monitor voiding function. Add Flomax.  Hewitt Shorts, MD 09/03/2012, 7:47 AM

## 2012-09-03 NOTE — Progress Notes (Signed)
UR COMPLETED  

## 2012-09-04 MED ORDER — TAMSULOSIN HCL 0.4 MG PO CAPS: 0.4000 mg | ORAL_CAPSULE | Freq: Every day | ORAL | Status: DC

## 2012-09-04 MED ORDER — CYCLOBENZAPRINE HCL 10 MG PO TABS: 5.0000 mg | ORAL_TABLET | Freq: Three times a day (TID) | ORAL | Status: DC | PRN

## 2012-09-04 MED ORDER — OXYCODONE-ACETAMINOPHEN 5-325 MG PO TABS
1.0000 | ORAL_TABLET | ORAL | Status: DC | PRN
Start: 2012-09-04 — End: 2013-12-13

## 2012-09-04 MED ORDER — OXYCODONE-ACETAMINOPHEN 5-325 MG PO TABS
1.0000 | ORAL_TABLET | ORAL | Status: DC | PRN
  Administered 2012-09-04: 2 via ORAL
  Filled 2012-09-04: qty 2

## 2012-09-04 NOTE — Discharge Summary (Signed)
Physician Discharge Summary  Patient ID: Calvin Marks MRN: [id number] DOB/AGE: 02/09/1936 77 y.o.  Admit date: 09/02/2012 Discharge date: 09/04/2012  Admission Diagnoses:  Lumbar stenosis, lumbar spondylolisthesis, lumbar degenerative disc disease  Discharge Diagnoses:  Lumbar stenosis, lumbar spondylolisthesis, lumbar degenerative disc disease  Discharged Condition: good  Hospital Course: Patient was admitted underwent an L2-L4 lumbar decompression, PLIF, and PLA. He has done well following surgery he is up and ambulating. We had mild difficulty placing his Foley catheter for surgery, we had to use a coud catheter. The catheter was discontinued on the first postoperative day, and he has been voiding well, with normal PVRs. He was on Proscar prior to hospitalization, we added Flomax, and are going to have him continue on Flomax for 3 weeks following discharge, and then its to be discontinued. It has been explained to him to continue the Proscar until directed otherwise by his primary physician. He is up and ambulating actively in the halls. His dressing was removed, and his wound is healing nicely, there is no swelling, erythema, or drainage. He is being discharged home with instructions regarding wound care and activities. He is to followup with me in the office in 3 weeks.  Discharge Exam: Blood pressure 89/57, pulse 96, temperature 98.6 F (37 C), temperature source Oral, resp. rate 14, SpO2 94.00%.  Disposition: Home     Medication List     As of 09/04/2012  8:19 AM    TAKE these medications         chlorpheniramine 4 MG tablet   Commonly known as: CHLOR-TRIMETON   Take 4 mg by mouth 2 (two) times daily as needed. For allergies      cyclobenzaprine 10 MG tablet   Commonly known as: FLEXERIL   Take 0.5-1 tablets (5-10 mg total) by mouth 3 (three) times daily as needed for muscle spasms.      diazepam 10 MG tablet   Commonly known as: VALIUM   Take 10 mg by mouth at  bedtime.      finasteride 5 MG tablet   Commonly known as: PROSCAR   Take 5 mg by mouth every morning.      fish oil-omega-3 fatty acids 1000 MG capsule   Take 1 g by mouth every morning.      HYDROcodone-acetaminophen 7.5-500 MG per tablet   Commonly known as: LORTAB   Take 1 tablet by mouth every 4 (four) hours as needed. For pain      Melatonin 10 MG Caps   Take 1 tablet by mouth at bedtime as needed. For sleep      meloxicam 15 MG tablet   Commonly known as: MOBIC   Take 15 mg by mouth daily.      metoprolol tartrate 25 MG tablet   Commonly known as: LOPRESSOR   Take 25 mg by mouth every morning.      omeprazole 20 MG capsule   Commonly known as: PRILOSEC   Take 20 mg by mouth every morning.      oxyCODONE-acetaminophen 5-325 MG per tablet   Commonly known as: PERCOCET/ROXICET   Take 1-2 tablets by mouth every 4 (four) hours as needed for pain.      polyethylene glycol packet   Commonly known as: MIRALAX / GLYCOLAX   Take 9 g by mouth every morning.      pravastatin 40 MG tablet   Commonly known as: PRAVACHOL   Take 40 mg by mouth every morning.  Tamsulosin HCl 0.4 MG Caps   Commonly known as: FLOMAX   Take 1 capsule (0.4 mg total) by mouth daily after breakfast.      triamterene-hydrochlorothiazide 37.5-25 MG per tablet   Commonly known as: MAXZIDE-25   Take 1 tablet by mouth every morning.         Signed: Hewitt Shorts, MD 09/04/2012, 8:19 AM

## 2013-09-21 ENCOUNTER — Other Ambulatory Visit: Payer: Self-pay | Admitting: Neurosurgery

## 2013-09-21 DIAGNOSIS — M4306 Spondylolysis, lumbar region: Secondary | ICD-10-CM

## 2013-09-24 ENCOUNTER — Ambulatory Visit
Admission: RE | Admit: 2013-09-24 | Discharge: 2013-09-24 | Disposition: A | Discharge status: Home / Self Care | Payer: Medicare Other | Source: Ambulatory Visit | Attending: Neurosurgery | Admitting: Neurosurgery

## 2013-09-24 DIAGNOSIS — M4306 Spondylolysis, lumbar region: Secondary | ICD-10-CM

## 2013-12-08 ENCOUNTER — Other Ambulatory Visit: Payer: Self-pay | Admitting: Neurosurgery

## 2013-12-10 NOTE — Pre-Procedure Instructions (Signed)
Calvin Marks  12/10/2013   Your procedure is scheduled on:  Thursday Dec 23, 2013 at 7:30 AM.  [facility]"  Admitting at 5:30 AM.  Call this number if you have problems the morning of surgery: [phone]   Remember:   Do not eat food or drink liquids after midnight.   Take these medicines the morning of surgery with A SIP OF WATER: Finasteride (Proscar), Hydrocodone if needed for pain, Metoprolol (Lopressor), Lansoprazole (Prevacid)   Discontinue aspirin and herbal medications 7 days prior to surgery    Do not wear jewelry.  Do not wear lotions, powders, or cologne.   Men may shave face and neck.  Do not bring valuables to the hospital.  [facility] is not responsible for any belongings or valuables.               Contacts, dentures or bridgework may not be worn into surgery.  Leave suitcase in the car. After surgery it may be brought to your room.  For patients admitted to the hospital, discharge time is determined by your treatment team.               Patients discharged the day of surgery will not be allowed to drive home.  Name and phone number of your driver: Family/Friend  Special Instructions: Shower the night before and the morning of your surgery using CHG soap   Please read over the following fact sheets that you were given: Pain Booklet, Coughing and Deep Breathing, Blood Transfusion Information, MRSA Information and Surgical Site Infection Prevention

## 2013-12-13 ENCOUNTER — Encounter (HOSPITAL_COMMUNITY)
Admission: RE | Admit: 2013-12-13 | Discharge: 2013-12-13 | Disposition: A | Discharge status: Home / Self Care | Payer: Medicare Other | Source: Ambulatory Visit | Attending: Neurosurgery | Admitting: Neurosurgery

## 2013-12-13 ENCOUNTER — Ambulatory Visit (HOSPITAL_COMMUNITY)
Admission: RE | Admit: 2013-12-13 | Discharge: 2013-12-13 | Disposition: A | Discharge status: Home / Self Care | Payer: Medicare Other | Source: Ambulatory Visit | Attending: Neurosurgery | Admitting: Neurosurgery

## 2013-12-13 ENCOUNTER — Encounter (HOSPITAL_COMMUNITY): Payer: Self-pay

## 2013-12-13 DIAGNOSIS — Z01818 Encounter for other preprocedural examination: Secondary | ICD-10-CM | POA: Insufficient documentation

## 2013-12-13 DIAGNOSIS — Z01812 Encounter for preprocedural laboratory examination: Secondary | ICD-10-CM | POA: Insufficient documentation

## 2013-12-13 DIAGNOSIS — Z01810 Encounter for preprocedural cardiovascular examination: Secondary | ICD-10-CM | POA: Insufficient documentation

## 2013-12-13 HISTORY — DX: Constipation, unspecified: K59.00

## 2013-12-13 HISTORY — DX: Other allergy status, other than to drugs and biological substances: Z91.09

## 2013-12-13 HISTORY — DX: Benign prostatic hyperplasia without lower urinary tract symptoms: N40.0

## 2013-12-13 LAB — CBC
HCT: 41.8 % (ref 39.0–52.0)
Hemoglobin: 14.3 g/dL (ref 13.0–17.0)
MCH: 31 pg (ref 26.0–34.0)
MCHC: 34.2 g/dL (ref 30.0–36.0)
MCV: 90.5 fL (ref 78.0–100.0)
Platelets: 229 10*3/uL (ref 150–400)
RBC: 4.62 MIL/uL (ref 4.22–5.81)
RDW: 13.4 % (ref 11.5–15.5)
WBC: 5.6 10*3/uL (ref 4.0–10.5)

## 2013-12-13 LAB — TYPE AND SCREEN
ABO/RH(D): O NEG
Antibody Screen: NEGATIVE

## 2013-12-13 LAB — BASIC METABOLIC PANEL
BUN: 26 mg/dL — ABNORMAL HIGH (ref 6–23)
CO2: 27 mEq/L (ref 19–32)
Calcium: 9.4 mg/dL (ref 8.4–10.5)
Chloride: 102 mEq/L (ref 96–112)
Creatinine, Ser: 1.07 mg/dL (ref 0.50–1.35)
GFR calc Af Amer: 76 mL/min — ABNORMAL LOW (ref >90)
GFR calc non Af Amer: 66 mL/min — ABNORMAL LOW (ref >90)
Glucose, Bld: 99 mg/dL (ref 70–99)
Potassium: 4.5 mEq/L (ref 3.7–5.3)
Sodium: 140 mEq/L (ref 137–147)

## 2013-12-13 LAB — SURGICAL PCR SCREEN
MRSA, PCR: NEGATIVE
Staphylococcus aureus: NEGATIVE

## 2013-12-13 NOTE — Progress Notes (Signed)
PCP is Dr. Kirby FunkJohn Marks. Patient denied having a stress test, cardiac cath, or sleep study.

## 2013-12-13 NOTE — Progress Notes (Signed)
12/13/13 1511  OBSTRUCTIVE SLEEP APNEA  Have you ever been diagnosed with sleep apnea through a sleep study? No  Do you snore loudly (loud enough to be heard through closed doors)?  1  Do you often feel tired, fatigued, or sleepy during the daytime? 0  Has anyone observed you stop breathing during your sleep? 0  Do you have, or are you being treated for high blood pressure? 1  BMI more than 35 kg/m2? 0  Age over 76 years old? 1  Neck circumference greater than 40 cm/16 inches? 0  Gender: 1  Obstructive Sleep Apnea Score 4  Score 4 or greater  Results sent to PCP   This patient has screened at risk for sleep apnea using the STOP Bang tool used during a pre-surgical visit. A score of 4 or greater is at risk for sleep apnea.

## 2013-12-14 NOTE — Progress Notes (Signed)
Anesthesia Chart Review: Patient is a 78 year old male scheduled for L4-5 decompression with PLIF on 12/23/13 by Dr. Newell CoralNudelman.    History includes non-smoker, HTN, GERD, depression, arthritis, BPH, L2-4 laminectomy/PLIF 09/02/12. OSA screening score was a 4. PCP is listed as Dr. Kirby FunkJohn Griffin.   EKG on 12/13/13 showed NSR with sinus arrhythmia, cannot rule out anterior infarct (age undetermined). I think it appears stable since his previous EKG on 08/26/12.  No CV symptoms documented at PAT.   CXR on 12/13/13 showed no active cardiopulmonary abnormalities, thoracic spondylosis noted.   Preoperative labs noted.   EKG appears stable since 08/2012, and he is s/p PLIF since then.  There is no reported history of CAD/MI/CHF or diabetes mellitus, so would anticipate that if he remains asymptomatic from a CV standpoint then he could proceed as planned.   Velna Ochsllison Kirubel Aja, PA-C [facility]/Anesthesiology Phone [phone] 12/14/2013 4:47 PM

## 2013-12-22 MED ORDER — CEFAZOLIN SODIUM-DEXTROSE 2-3 GM-% IV SOLR
2.0000 g | INTRAVENOUS | Status: AC
Start: 2013-12-23 — End: 2013-12-23
  Administered 2013-12-23: 2 g via INTRAVENOUS
  Filled 2013-12-22: qty 50

## 2013-12-23 ENCOUNTER — Inpatient Hospital Stay (HOSPITAL_COMMUNITY): Payer: Medicare Other | Admitting: Anesthesiology

## 2013-12-23 ENCOUNTER — Encounter (HOSPITAL_COMMUNITY): Payer: Medicare Other | Admitting: Vascular Surgery

## 2013-12-23 ENCOUNTER — Encounter (HOSPITAL_COMMUNITY): Payer: Self-pay | Admitting: Surgery

## 2013-12-23 ENCOUNTER — Inpatient Hospital Stay (HOSPITAL_COMMUNITY): Payer: Medicare Other

## 2013-12-23 ENCOUNTER — Inpatient Hospital Stay (HOSPITAL_COMMUNITY)
Admission: RE | Admit: 2013-12-23 | Discharge: 2013-12-24 | DRG: 460 | Disposition: A | Discharge status: Home / Self Care | Payer: Medicare Other | Source: Ambulatory Visit | Attending: Neurosurgery | Admitting: Neurosurgery

## 2013-12-23 ENCOUNTER — Encounter (HOSPITAL_COMMUNITY)
Admission: RE | Disposition: A | Discharge status: Home / Self Care | Payer: Self-pay | Source: Ambulatory Visit | Attending: Neurosurgery

## 2013-12-23 DIAGNOSIS — N400 Enlarged prostate without lower urinary tract symptoms: Secondary | ICD-10-CM | POA: Diagnosis present

## 2013-12-23 DIAGNOSIS — M48062 Spinal stenosis, lumbar region with neurogenic claudication: Principal | ICD-10-CM | POA: Diagnosis present

## 2013-12-23 DIAGNOSIS — Z981 Arthrodesis status: Secondary | ICD-10-CM

## 2013-12-23 DIAGNOSIS — F329 Major depressive disorder, single episode, unspecified: Secondary | ICD-10-CM | POA: Diagnosis present

## 2013-12-23 DIAGNOSIS — K219 Gastro-esophageal reflux disease without esophagitis: Secondary | ICD-10-CM | POA: Diagnosis present

## 2013-12-23 DIAGNOSIS — M129 Arthropathy, unspecified: Secondary | ICD-10-CM | POA: Diagnosis present

## 2013-12-23 DIAGNOSIS — I10 Essential (primary) hypertension: Secondary | ICD-10-CM | POA: Diagnosis present

## 2013-12-23 DIAGNOSIS — F3289 Other specified depressive episodes: Secondary | ICD-10-CM | POA: Diagnosis present

## 2013-12-23 SURGERY — POSTERIOR LUMBAR FUSION 1 LEVEL
Anesthesia: General | Site: Back

## 2013-12-23 MED ORDER — DEXTROSE-NACL 5-0.45 % IV SOLN: INTRAVENOUS | Status: DC

## 2013-12-23 MED ORDER — FENTANYL CITRATE 0.05 MG/ML IJ SOLN
INTRAMUSCULAR | Status: DC | PRN
  Administered 2013-12-23 (×2): 100 ug via INTRAVENOUS
  Administered 2013-12-23 (×2): 50 ug via INTRAVENOUS
  Administered 2013-12-23: 100 ug via INTRAVENOUS
  Administered 2013-12-23: 50 ug via INTRAVENOUS

## 2013-12-23 MED ORDER — SODIUM CHLORIDE 0.9 % IJ SOLN
3.0000 mL | Freq: Two times a day (BID) | INTRAMUSCULAR | Status: DC
  Administered 2013-12-23 – 2013-12-24 (×3): 3 mL via INTRAVENOUS

## 2013-12-23 MED ORDER — LIDOCAINE-EPINEPHRINE 1 %-1:100000 IJ SOLN
INTRAMUSCULAR | Status: DC | PRN
  Administered 2013-12-23: 15 mL

## 2013-12-23 MED ORDER — TRIAMTERENE-HCTZ 37.5-25 MG PO TABS
1.0000 | ORAL_TABLET | Freq: Every morning | ORAL | Status: DC
  Administered 2013-12-24: 1 via ORAL
  Filled 2013-12-23 (×2): qty 1

## 2013-12-23 MED ORDER — LIDOCAINE HCL (CARDIAC) 20 MG/ML IV SOLN
INTRAVENOUS | Status: AC
Start: 2013-12-23 — End: 2013-12-23
  Filled 2013-12-23: qty 5

## 2013-12-23 MED ORDER — ACETAMINOPHEN 650 MG RE SUPP: 650.0000 mg | RECTAL | Status: DC | PRN

## 2013-12-23 MED ORDER — HYDROCODONE-ACETAMINOPHEN 5-325 MG PO TABS: 1.0000 | ORAL_TABLET | ORAL | Status: DC | PRN

## 2013-12-23 MED ORDER — HYDROXYZINE HCL 25 MG PO TABS: 50.0000 mg | ORAL_TABLET | ORAL | Status: DC | PRN

## 2013-12-23 MED ORDER — KETOROLAC TROMETHAMINE 30 MG/ML IJ SOLN
30.0000 mg | Freq: Four times a day (QID) | INTRAMUSCULAR | Status: DC
  Administered 2013-12-23 – 2013-12-24 (×4): 30 mg via INTRAVENOUS
  Filled 2013-12-23 (×7): qty 1

## 2013-12-23 MED ORDER — BUPIVACAINE HCL (PF) 0.5 % IJ SOLN
INTRAMUSCULAR | Status: DC | PRN
  Administered 2013-12-23: 15 mL

## 2013-12-23 MED ORDER — SODIUM CHLORIDE 0.9 % IJ SOLN: 3.0000 mL | INTRAMUSCULAR | Status: DC | PRN

## 2013-12-23 MED ORDER — GLYCOPYRROLATE 0.2 MG/ML IJ SOLN
INTRAMUSCULAR | Status: DC | PRN
  Administered 2013-12-23: .7 mg via INTRAVENOUS

## 2013-12-23 MED ORDER — ONDANSETRON HCL 4 MG/2ML IJ SOLN
INTRAMUSCULAR | Status: DC | PRN
  Administered 2013-12-23: 4 mg via INTRAVENOUS

## 2013-12-23 MED ORDER — OXYCODONE-ACETAMINOPHEN 5-325 MG PO TABS
1.0000 | ORAL_TABLET | ORAL | Status: DC | PRN
  Administered 2013-12-23 – 2013-12-24 (×3): 2 via ORAL
  Administered 2013-12-24: 1 via ORAL
  Administered 2013-12-24 (×2): 2 via ORAL
  Filled 2013-12-23 (×6): qty 2

## 2013-12-23 MED ORDER — ROCURONIUM BROMIDE 50 MG/5ML IV SOLN
INTRAVENOUS | Status: AC
  Filled 2013-12-23: qty 1

## 2013-12-23 MED ORDER — PHENOL 1.4 % MT LIQD: 1.0000 | OROMUCOSAL | Status: DC | PRN

## 2013-12-23 MED ORDER — NEOSTIGMINE METHYLSULFATE 10 MG/10ML IV SOLN
INTRAVENOUS | Status: AC
  Filled 2013-12-23: qty 1

## 2013-12-23 MED ORDER — GLYCOPYRROLATE 0.2 MG/ML IJ SOLN
INTRAMUSCULAR | Status: AC
  Filled 2013-12-23: qty 4

## 2013-12-23 MED ORDER — PANTOPRAZOLE SODIUM 40 MG PO TBEC
40.0000 mg | DELAYED_RELEASE_TABLET | Freq: Every day | ORAL | Status: DC
  Administered 2013-12-23 – 2013-12-24 (×2): 40 mg via ORAL
  Filled 2013-12-23 (×3): qty 1

## 2013-12-23 MED ORDER — HYDROMORPHONE HCL PF 1 MG/ML IJ SOLN
0.2500 mg | INTRAMUSCULAR | Status: DC | PRN
  Administered 2013-12-23 (×2): 0.5 mg via INTRAVENOUS

## 2013-12-23 MED ORDER — PROPOFOL 10 MG/ML IV BOLUS
INTRAVENOUS | Status: AC
  Filled 2013-12-23: qty 20

## 2013-12-23 MED ORDER — MIDAZOLAM HCL 2 MG/2ML IJ SOLN
INTRAMUSCULAR | Status: AC
  Filled 2013-12-23: qty 2

## 2013-12-23 MED ORDER — TRAZODONE HCL 100 MG PO TABS
100.0000 mg | ORAL_TABLET | Freq: Every day | ORAL | Status: DC
  Administered 2013-12-23: 100 mg via ORAL
  Filled 2013-12-23 (×2): qty 1

## 2013-12-23 MED ORDER — FENTANYL CITRATE 0.05 MG/ML IJ SOLN
INTRAMUSCULAR | Status: AC
  Filled 2013-12-23: qty 5

## 2013-12-23 MED ORDER — MELATONIN 5 MG PO CAPS: 5.0000 mg | ORAL_CAPSULE | Freq: Every day | ORAL | Status: DC

## 2013-12-23 MED ORDER — ONDANSETRON HCL 4 MG/2ML IJ SOLN
INTRAMUSCULAR | Status: AC
  Filled 2013-12-23: qty 2

## 2013-12-23 MED ORDER — KETOROLAC TROMETHAMINE 30 MG/ML IJ SOLN
INTRAMUSCULAR | Status: AC
  Filled 2013-12-23: qty 1

## 2013-12-23 MED ORDER — ACETAMINOPHEN 10 MG/ML IV SOLN
INTRAVENOUS | Status: AC
  Administered 2013-12-23: 1000 mg via INTRAVENOUS
  Filled 2013-12-23: qty 100

## 2013-12-23 MED ORDER — LIDOCAINE HCL (CARDIAC) 20 MG/ML IV SOLN
INTRAVENOUS | Status: DC | PRN
  Administered 2013-12-23: 80 mg via INTRAVENOUS

## 2013-12-23 MED ORDER — MORPHINE SULFATE 4 MG/ML IJ SOLN: 4.0000 mg | INTRAMUSCULAR | Status: DC | PRN

## 2013-12-23 MED ORDER — THROMBIN 20000 UNITS EX SOLR
CUTANEOUS | Status: DC | PRN
  Administered 2013-12-23: 08:00:00 via TOPICAL

## 2013-12-23 MED ORDER — BISACODYL 10 MG RE SUPP: 10.0000 mg | Freq: Every day | RECTAL | Status: DC | PRN

## 2013-12-23 MED ORDER — CYCLOBENZAPRINE HCL 10 MG PO TABS
10.0000 mg | ORAL_TABLET | Freq: Three times a day (TID) | ORAL | Status: DC | PRN
  Administered 2013-12-23 – 2013-12-24 (×2): 10 mg via ORAL
  Filled 2013-12-23 (×3): qty 1

## 2013-12-23 MED ORDER — ACETAMINOPHEN 325 MG PO TABS: 650.0000 mg | ORAL_TABLET | ORAL | Status: DC | PRN

## 2013-12-23 MED ORDER — FINASTERIDE 5 MG PO TABS
5.0000 mg | ORAL_TABLET | Freq: Every morning | ORAL | Status: DC
  Administered 2013-12-24: 5 mg via ORAL
  Filled 2013-12-23 (×2): qty 1

## 2013-12-23 MED ORDER — PROPOFOL 10 MG/ML IV BOLUS
INTRAVENOUS | Status: DC | PRN
  Administered 2013-12-23: 80 mg via INTRAVENOUS

## 2013-12-23 MED ORDER — NEOSTIGMINE METHYLSULFATE 10 MG/10ML IV SOLN
INTRAVENOUS | Status: DC | PRN
  Administered 2013-12-23: 4 mg via INTRAVENOUS

## 2013-12-23 MED ORDER — ONDANSETRON HCL 4 MG/2ML IJ SOLN: 4.0000 mg | Freq: Four times a day (QID) | INTRAMUSCULAR | Status: DC | PRN

## 2013-12-23 MED ORDER — MENTHOL 3 MG MT LOZG: 1.0000 | LOZENGE | OROMUCOSAL | Status: DC | PRN

## 2013-12-23 MED ORDER — MIDAZOLAM HCL 5 MG/5ML IJ SOLN
INTRAMUSCULAR | Status: DC | PRN
  Administered 2013-12-23: 2 mg via INTRAVENOUS

## 2013-12-23 MED ORDER — KETOROLAC TROMETHAMINE 30 MG/ML IJ SOLN
30.0000 mg | Freq: Once | INTRAMUSCULAR | Status: AC
  Administered 2013-12-23: 30 mg via INTRAVENOUS

## 2013-12-23 MED ORDER — ALUM & MAG HYDROXIDE-SIMETH 200-200-20 MG/5ML PO SUSP
30.0000 mL | Freq: Four times a day (QID) | ORAL | Status: DC | PRN
  Filled 2013-12-23: qty 30

## 2013-12-23 MED ORDER — LACTATED RINGERS IV SOLN
INTRAVENOUS | Status: DC | PRN
  Administered 2013-12-23 (×2): via INTRAVENOUS

## 2013-12-23 MED ORDER — SODIUM CHLORIDE 0.9 % IR SOLN
Status: DC | PRN
  Administered 2013-12-23: 08:00:00

## 2013-12-23 MED ORDER — MAGNESIUM HYDROXIDE 400 MG/5ML PO SUSP: 30.0000 mL | Freq: Every day | ORAL | Status: DC | PRN

## 2013-12-23 MED ORDER — HYDROMORPHONE HCL PF 1 MG/ML IJ SOLN
INTRAMUSCULAR | Status: AC
  Filled 2013-12-23: qty 1

## 2013-12-23 MED ORDER — ROCURONIUM BROMIDE 100 MG/10ML IV SOLN
INTRAVENOUS | Status: DC | PRN
  Administered 2013-12-23: 50 mg via INTRAVENOUS

## 2013-12-23 MED ORDER — 0.9 % SODIUM CHLORIDE (POUR BTL) OPTIME
TOPICAL | Status: DC | PRN
  Administered 2013-12-23: 1000 mL

## 2013-12-23 MED ORDER — METOPROLOL TARTRATE 25 MG PO TABS
25.0000 mg | ORAL_TABLET | Freq: Every morning | ORAL | Status: DC
  Administered 2013-12-24: 25 mg via ORAL
  Filled 2013-12-23 (×2): qty 1

## 2013-12-23 SURGICAL SUPPLY — 74 items
BAG DECANTER FOR FLEXI CONT (MISCELLANEOUS) ×2 IMPLANT
BLADE 10 SAFETY STRL DISP (BLADE) ×2 IMPLANT
BLADE SURG ROTATE 9660 (MISCELLANEOUS) IMPLANT
BRUSH SCRUB EZ PLAIN DRY (MISCELLANEOUS) ×2 IMPLANT
BUR ACRON 5.0MM COATED (BURR) ×2 IMPLANT
BUR MATCHSTICK NEURO 3.0 LAGG (BURR) ×2 IMPLANT
CANISTER SUCT 3000ML (MISCELLANEOUS) ×2 IMPLANT
CAP LCK SPNE (Orthopedic Implant) ×8 IMPLANT
CAP LOCK SPINE RADIUS (Orthopedic Implant) ×8 IMPLANT
CAP LOCKING (Orthopedic Implant) ×8 IMPLANT
CONT SPEC 4OZ CLIKSEAL STRL BL (MISCELLANEOUS) ×4 IMPLANT
COVER BACK TABLE 24X17X13 BIG (DRAPES) IMPLANT
COVER TABLE BACK 60X90 (DRAPES) ×2 IMPLANT
CROSSLINK MEDIUM (Orthopedic Implant) ×2 IMPLANT
DERMABOND ADHESIVE PROPEN (GAUZE/BANDAGES/DRESSINGS) ×3
DERMABOND ADVANCED (GAUZE/BANDAGES/DRESSINGS) ×2
DERMABOND ADVANCED .7 DNX12 (GAUZE/BANDAGES/DRESSINGS) ×2 IMPLANT
DERMABOND ADVANCED .7 DNX6 (GAUZE/BANDAGES/DRESSINGS) ×3 IMPLANT
DRAPE C-ARM 42X72 X-RAY (DRAPES) ×4 IMPLANT
DRAPE LAPAROTOMY 100X72X124 (DRAPES) ×2 IMPLANT
DRAPE POUCH INSTRU U-SHP 10X18 (DRAPES) ×2 IMPLANT
DRAPE PROXIMA HALF (DRAPES) IMPLANT
DRSG EMULSION OIL 3X3 NADH (GAUZE/BANDAGES/DRESSINGS) IMPLANT
ELECT REM PT RETURN 9FT ADLT (ELECTROSURGICAL) ×2
ELECTRODE REM PT RTRN 9FT ADLT (ELECTROSURGICAL) ×1 IMPLANT
GAUZE SPONGE 4X4 16PLY XRAY LF (GAUZE/BANDAGES/DRESSINGS) IMPLANT
GLOVE BIO SURGEON STRL SZ8 (GLOVE) ×2 IMPLANT
GLOVE BIOGEL PI IND STRL 8 (GLOVE) ×2 IMPLANT
GLOVE BIOGEL PI INDICATOR 8 (GLOVE) ×2
GLOVE ECLIPSE 7.5 STRL STRAW (GLOVE) ×4 IMPLANT
GLOVE EXAM NITRILE LRG STRL (GLOVE) IMPLANT
GLOVE EXAM NITRILE MD LF STRL (GLOVE) IMPLANT
GLOVE EXAM NITRILE XL STR (GLOVE) IMPLANT
GLOVE EXAM NITRILE XS STR PU (GLOVE) IMPLANT
GOWN BRE IMP SLV AUR LG STRL (GOWN DISPOSABLE) IMPLANT
GOWN BRE IMP SLV AUR XL STRL (GOWN DISPOSABLE) IMPLANT
GOWN STRL REIN 2XL LVL4 (GOWN DISPOSABLE) IMPLANT
GOWN STRL REUS W/ TWL XL LVL3 (GOWN DISPOSABLE) ×4 IMPLANT
GOWN STRL REUS W/TWL 2XL LVL3 (GOWN DISPOSABLE) ×4 IMPLANT
GOWN STRL REUS W/TWL XL LVL3 (GOWN DISPOSABLE) ×4
KIT BASIN OR (CUSTOM PROCEDURE TRAY) ×2 IMPLANT
KIT INFUSE SMALL (Orthopedic Implant) ×2 IMPLANT
KIT ROOM TURNOVER OR (KITS) ×2 IMPLANT
MILL MEDIUM DISP (BLADE) ×2 IMPLANT
NEEDLE BONE MARROW 8GAX6 (NEEDLE) ×2 IMPLANT
NEEDLE SPNL 18GX3.5 QUINCKE PK (NEEDLE) IMPLANT
NEEDLE SPNL 22GX3.5 QUINCKE BK (NEEDLE) ×4 IMPLANT
NS IRRIG 1000ML POUR BTL (IV SOLUTION) ×2 IMPLANT
PACK LAMINECTOMY NEURO (CUSTOM PROCEDURE TRAY) ×2 IMPLANT
PAD ARMBOARD 7.5X6 YLW CONV (MISCELLANEOUS) ×6 IMPLANT
PATTIES SURGICAL .5 X.5 (GAUZE/BANDAGES/DRESSINGS) IMPLANT
PATTIES SURGICAL .5 X1 (DISPOSABLE) IMPLANT
PATTIES SURGICAL 1X1 (DISPOSABLE) IMPLANT
PEEK PLIF AVS 8X25X4 DEGREE (Peek) ×4 IMPLANT
ROD 90MM RADIUS (Rod) ×4 IMPLANT
SCREW 5.75X45MM (Screw) ×2 IMPLANT
SCREW 5.75X50MM (Screw) ×2 IMPLANT
SPONGE GAUZE 4X4 12PLY (GAUZE/BANDAGES/DRESSINGS) ×2 IMPLANT
SPONGE LAP 4X18 X RAY DECT (DISPOSABLE) IMPLANT
SPONGE NEURO XRAY DETECT 1X3 (DISPOSABLE) IMPLANT
SPONGE SURGIFOAM ABS GEL 100 (HEMOSTASIS) ×2 IMPLANT
STRIP BIOACTIVE VITOSS 25X100X (Neuro Prosthesis/Implant) ×2 IMPLANT
STRIP BIOACTIVE VITOSS 25X52X4 (Orthopedic Implant) ×2 IMPLANT
SUT VIC AB 1 CT1 18XBRD ANBCTR (SUTURE) ×2 IMPLANT
SUT VIC AB 1 CT1 8-18 (SUTURE) ×2
SUT VIC AB 2-0 CP2 18 (SUTURE) ×8 IMPLANT
SYR 20ML ECCENTRIC (SYRINGE) ×2 IMPLANT
SYR 3ML LL SCALE MARK (SYRINGE) ×8 IMPLANT
SYR CONTROL 10ML LL (SYRINGE) ×2 IMPLANT
TAPE CLOTH SURG 4X10 WHT LF (GAUZE/BANDAGES/DRESSINGS) ×2 IMPLANT
TOWEL OR 17X24 6PK STRL BLUE (TOWEL DISPOSABLE) ×2 IMPLANT
TOWEL OR 17X26 10 PK STRL BLUE (TOWEL DISPOSABLE) ×2 IMPLANT
TRAY FOLEY CATH 14FRSI W/METER (CATHETERS) ×2 IMPLANT
WATER STERILE IRR 1000ML POUR (IV SOLUTION) ×2 IMPLANT

## 2013-12-23 NOTE — Anesthesia Preprocedure Evaluation (Signed)
Anesthesia Evaluation  Patient identified by MRN, date of birth, ID band Patient awake    Reviewed: Allergy & Precautions, H&P , NPO status , Patient's Chart, lab work & pertinent test results, reviewed documented beta blocker date and time   Airway Mallampati: III TM Distance: >3 FB Neck ROM: Full    Dental no notable dental hx. (+) Poor Dentition, Dental Advisory Given   Pulmonary neg pulmonary ROS,  breath sounds clear to auscultation  Pulmonary exam normal       Cardiovascular hypertension, On Medications and On Home Beta Blockers Rhythm:Regular Rate:Normal     Neuro/Psych Depression negative neurological ROS     GI/Hepatic Neg liver ROS, GERD-  Medicated and Controlled,  Endo/Other  negative endocrine ROS  Renal/GU negative Renal ROS  negative genitourinary   Musculoskeletal   Abdominal   Peds  Hematology negative hematology ROS (+)   Anesthesia Other Findings   Reproductive/Obstetrics negative OB ROS                           Anesthesia Physical Anesthesia Plan  ASA: II  Anesthesia Plan: General   Post-op Pain Management:    Induction: Intravenous  Airway Management Planned: Oral ETT  Additional Equipment:   Intra-op Plan:   Post-operative Plan: Extubation in OR  Informed Consent: I have reviewed the patients History and Physical, chart, labs and discussed the procedure including the risks, benefits and alternatives for the proposed anesthesia with the patient or authorized representative who has indicated his/her understanding and acceptance.   Dental advisory given  Plan Discussed with: CRNA and Surgeon  Anesthesia Plan Comments:         Anesthesia Quick Evaluation

## 2013-12-23 NOTE — Plan of Care (Signed)
Problem: Consults Goal: Diagnosis - Spinal Surgery Outcome: Completed/Met Date Met:  12/23/13 Thoraco/Lumbar Spine Fusion     

## 2013-12-23 NOTE — Progress Notes (Signed)
PHARMACIST - PHYSICIAN ORDER COMMUNICATION  CONCERNING: P&T Medication Policy on Herbal Medications  DESCRIPTION:  This patient's order for:  Melatonin 5mg  capsule  has been noted.  This product(s) is classified as an "herbal" or natural product. Due to a lack of definitive safety studies or FDA approval, nonstandard manufacturing practices, plus the potential risk of unknown drug-drug interactions while on inpatient medications, the Pharmacy and Therapeutics Committee does not permit the use of "herbal" or natural products of this type within [facility].   ACTION TAKEN: The pharmacy department is unable to verify this order at this time and your patient has been informed of this safety policy. Please reevaluate patient's clinical condition at discharge and address if the herbal or natural product(s) should be resumed at that time.

## 2013-12-23 NOTE — Anesthesia Procedure Notes (Signed)
Procedure Name: Intubation Date/Time: 12/23/2013 7:38 AM Performed by: Reine JustFLOWERS, Grady Lucci T Pre-anesthesia Checklist: Patient identified, Timeout performed, Emergency Drugs available, Suction available and Patient being monitored Patient Re-evaluated:Patient Re-evaluated prior to inductionOxygen Delivery Method: Circle system utilized Preoxygenation: Pre-oxygenation with 100% oxygen Intubation Type: IV induction Ventilation: Mask ventilation without difficulty Laryngoscope Size: Miller and 2 Grade View: Grade I Tube type: Oral Tube size: 7.5 mm Number of attempts: 1 Airway Equipment and Method: Patient positioned with wedge pillow and Stylet Placement Confirmation: ETT inserted through vocal cords under direct vision,  positive ETCO2 and breath sounds checked- equal and bilateral Secured at: 21 cm Tube secured with: Tape Dental Injury: Teeth and Oropharynx as per pre-operative assessment

## 2013-12-23 NOTE — Progress Notes (Signed)
Filed Vitals:   12/23/13 1314 12/23/13 1315 12/23/13 1405 12/23/13 1627  BP:  134/71 140/69 145/64  Pulse: 73 74 74 70  Temp: 97.8 F (36.6 C)  97.9 F (36.6 C) 98 F (36.7 C)  TempSrc:      Resp: 21 20 20 18   SpO2: 99% 99% 94% 95%    Patient sitting up in chair, has ambulated in the hall. Dressing clean and dry.  Comfortable. Moving all 4 extremities well.  Plan: Encouraged further ambulation. Will DC Foley. Continue to progress through postoperative recovery.  Hewitt Shortsobert W Nudelman, MD 12/23/2013, 4:34 PM

## 2013-12-23 NOTE — Transfer of Care (Signed)
Immediate Anesthesia Transfer of Care Note  Patient: Calvin Marks  Procedure(s) Performed: Procedure(s) with comments: POSTERIOR LUMBAR FUSION 1 LEVEL (N/A) - L45 decompression with posterior lumbar interbody fusion with interbody prosthesis posterior lateral arthrodesis and posterior nonsegmental instrumentation  Patient Location: PACU  Anesthesia Type:General  Level of Consciousness: awake and alert   Airway & Oxygen Therapy: Patient Spontanous Breathing and Patient connected to nasal cannula oxygen  Post-op Assessment: Report given to PACU RN, Post -op Vital signs reviewed and stable and Patient moving all extremities X 4  Post vital signs: Reviewed and stable  Complications: No apparent anesthesia complications

## 2013-12-23 NOTE — Anesthesia Postprocedure Evaluation (Signed)
  Anesthesia Post-op Note  Patient: Calvin Marks  Procedure(s) Performed: Procedure(s) with comments: POSTERIOR LUMBAR FUSION 1 LEVEL (N/A) - L45 decompression with posterior lumbar interbody fusion with interbody prosthesis posterior lateral arthrodesis and posterior nonsegmental instrumentation  Patient Location: PACU  Anesthesia Type:General  Level of Consciousness: awake and alert   Airway and Oxygen Therapy: Patient Spontanous Breathing  Post-op Pain: moderate  Post-op Assessment: Post-op Vital signs reviewed, Patient's Cardiovascular Status Stable and Respiratory Function Stable  Post-op Vital Signs: Reviewed  Filed Vitals:   12/23/13 1255  BP:   Pulse: 76  Temp:   Resp: 18    Complications: No apparent anesthesia complications

## 2013-12-23 NOTE — H&P (Signed)
Subjective: Patient is a 78 y.o. male who is admitted for treatment of multifactorial lumbar stenosis at the L4-5 level.  Patient is status post a previous L2-3 and L3-4 lumbar decompression, PLIF, and PLA in January 2014. He complains of pain in the lower lumbar region, cramping in the distal left lower extremity, particularly the left leg and calf, more so than the distal right lower extremity, with occasional numbness in the anterior thigh, again left worse than right. He is admitted for a L4-5 lumbar decompression including laminectomy, facetectomy, and foraminotomy, bilateral L4-5 posterior lumbar interbody arthrodesis with interbody implants and bone graft, and L4-5 posterolateral arthrodesis, posterior instrumentation and bone graft.   Past Medical History  Diagnosis Date  . Hypertension   . Depression   . GERD (gastroesophageal reflux disease)   . Arthritis   . Environmental allergies   . Enlarged prostate   . Constipation     Past Surgical History  Procedure Laterality Date  . Colonoscopy    . Dental surgery    . Back surgery      Prescriptions prior to admission  Medication Sig Dispense Refill  . chlorpheniramine (CHLOR-TRIMETON) 4 MG tablet Take 4 mg by mouth 2 (two) times daily as needed. For allergies      . cyclobenzaprine (FLEXERIL) 10 MG tablet Take 0.5-1 tablets (5-10 mg total) by mouth 3 (three) times daily as needed for muscle spasms.  50 tablet  1  . finasteride (PROSCAR) 5 MG tablet Take 5 mg by mouth every morning.      [name] Kitchen. HYDROcodone-acetaminophen (LORTAB) 7.5-500 MG per tablet Take 1 tablet by mouth every 4 (four) hours as needed. For pain      . lansoprazole (PREVACID) 30 MG capsule Take 30 mg by mouth daily at 12 noon.      . Melatonin 5 MG CAPS Take 5 mg by mouth at bedtime.      . meloxicam (MOBIC) 15 MG tablet Take 15 mg by mouth daily.      . metoprolol tartrate (LOPRESSOR) 25 MG tablet Take 25 mg by mouth every morning.      . polyethylene glycol (MIRALAX /  GLYCOLAX) packet Take 9-17 g by mouth daily as needed (constipation).       . traZODone (DESYREL) 100 MG tablet Take 100 mg by mouth at bedtime.      . triamterene-hydrochlorothiazide (MAXZIDE-25) 37.5-25 MG per tablet Take 1 tablet by mouth every morning.      . fish oil-omega-3 fatty acids 1000 MG capsule Take 1 g by mouth every morning.       Allergies  Allergen Reactions  . Ciprofloxacin Hives  . Pravastatin     History  Substance Use Topics  . Smoking status: Never Smoker   . Smokeless tobacco: Not on file  . Alcohol Use: Yes     Comment: socially    History reviewed. No pertinent family history.   Review of Systems A comprehensive review of systems was negative.  Objective: Vital signs in last 24 hours: Temp:  [97.8 F (36.6 C)] 97.8 F (36.6 C) (05/07 0601) Pulse Rate:  [73] 73 (05/07 0601) Resp:  [20] 20 (05/07 0601) BP: (124)/(71) 124/71 mmHg (05/07 0601) SpO2:  [98 %] 98 % (05/07 0601)  EXAM: Patient well-developed well-nourished white male in no acute distress. Lungs are clear to auscultation , the patient has symmetrical respiratory excursion. Heart has a regular rate and rhythm normal S1 and S2 no murmur.   Abdomen is soft nontender  nondistended bowel sounds are present. Extremity examination shows no clubbing cyanosis or edema. Motor examination shows 5 over 5 strength in the lower extremities including the iliopsoas quadriceps dorsiflexor extensor hallicus  longus and plantar flexor bilaterally. Sensation is somewhat less intense in the left lower extremity compared to the right lower extremity. Reflexes are symmetrical bilaterally. No pathologic reflexes are present. Patient has a normal gait and stance.   Data Review:CBC    Component Value Date/Time   WBC 5.6 12/13/2013 1558   RBC 4.62 12/13/2013 1558   HGB 14.3 12/13/2013 1558   HCT 41.8 12/13/2013 1558   PLT 229 12/13/2013 1558   MCV 90.5 12/13/2013 1558   MCH 31.0 12/13/2013 1558   MCHC 34.2 12/13/2013 1558    RDW 13.4 12/13/2013 1558                          BMET    Component Value Date/Time   NA 140 12/13/2013 1558   K 4.5 12/13/2013 1558   CL 102 12/13/2013 1558   CO2 27 12/13/2013 1558   GLUCOSE 99 12/13/2013 1558   BUN 26* 12/13/2013 1558   CREATININE 1.07 12/13/2013 1558   CALCIUM 9.4 12/13/2013 1558   GFRNONAA 66* 12/13/2013 1558   GFRAA 76* 12/13/2013 1558     Assessment/Plan: Patient with multilevel lumbar degenerative changes, with stenosis at the L4-5 level, and complaints of discomfort in the lower lumbar spine and into the lower extremities. Patient admitted now for an L4-5 lumbar decompression, PLIF, and PLA.  I've discussed with the patient the nature of his condition, the nature the surgical procedure, the typical length of surgery, hospital stay, and overall recuperation, the limitations postoperatively, and risks of surgery. I discussed risks including risks of infection, bleeding, possibly need for transfusion, the risk of nerve root dysfunction with pain, weakness, numbness, or paresthesias, the risk of dural tear and CSF leakage and possible need for further surgery, the risk of failure of the arthrodesis and possibly for further surgery, the risk of anesthetic complications including myocardial infarction, stroke, pneumonia, and death. We discussed the need for postoperative immobilization in a lumbar brace. Understanding all this the patient does wish to proceed with surgery and is admitted for such.     Hewitt Shortsobert W Nudelman, MD 12/23/2013 7:19 AM

## 2013-12-23 NOTE — Op Note (Signed)
12/23/2013  11:39 AM  PATIENT:  Calvin Marks  78 y.o. male  PRE-OPERATIVE DIAGNOSIS:  lumbar stenosis with neurogenic claudication, lumbar degenerative disc disease, lumbar spondylosis  POST-OPERATIVE DIAGNOSIS:  lumbar stenosis with neurogenic claudication, lumbar degenerative disc disease, lumbar spondylosis  PROCEDURE:  Procedure(s): POSTERIOR LUMBAR FUSION 1 LEVEL:  Bilateral L4-5 lumbar decompression including bilateral laminectomy, facetectomy, and foraminotomy, decompression of the stenotic compression of the thecal sac and exiting L4 and L5 nerve roots, with decompression beyond that required for interbody arthrodesis; bilateral L4-5 posterior lumbar interbody arthrodesis with AVS peek interbody implant, Vitoss BA with bone marrow aspirate, and infuse; bilateral L4-5 posterior lateral arthrodesis with radius segmental posterior instrumentation, locally harvested morcellized autograft, Vitoss BA with bone marrow aspirate, and infuse  SURGEON:  Surgeon(s): Hewitt Shortsobert W Nudelman, MD Tia Alertavid S Jones, MD  ASSISTANTS: Tia Alertavid S. Jones, M.D.  ANESTHESIA:   general  EBL:  Total I/O In: 1000 [I.V.:1000] Out: 400 [Urine:300; Blood:100]  BLOOD ADMINISTERED:none  CELL SAVER GIVEN: There was insufficient blood loss to return any Cell Saver blood recovered to the patient.  COUNT: Correct per nursing staff  DICTATION: Patient is brought to the operating room placed under general endotracheal anesthesia. The patient was turned to prone position the lumbar region was prepped with Betadine soap and solution and draped in a sterile fashion. The midline was infiltrated with local anesthesia with epinephrine. The previous midline incision was marked, and a midline incision was made through the previous midline incision, and carried down through the subcutaneous tissue, bipolar cautery and electrocautery were used to maintain hemostasis. The incision was extended somewhat further caudally. Dissection was  carried down to the lumbar fascia. The fascia was incised bilaterally and the paraspinal muscles were dissected from the L4 and L5 spinous processes and lamina in a subperiosteal fashion. An x-ray was taken for localization and the L4-5 level was localized. Dissection was then carried out laterally over the facet complex at the L4-5 level and the transverse processes of L4 and L5 were exposed and decorticated. Subfascial dissection was extended rostrally, in the midline and the cross connector at the L3-4 level was identified. Dissection was out laterally and the existing posterior instrumentation from L2-L4 was exposed. We then proceeded with the decompression at the L4-5 level. Laminectomy was performed with double-action rongeurs, the high-speed drill, and Kerrison punches. Hypertrophic facet arthropathy was removed using double-action rongeurs and the high-speed drill. Bone was saved during the decompression, cleaned of soft tissue, and morcellated, to later be implanted in the fusion construct. We found the ligamentum flavum to be markedly thickened, and this was carefully removed, decompressing the stenotic compression of the thecal sac. We continued the decompression laterally on each side. Facetectomy was performed at the L4-5 level, and foraminotomies were performed for the stenotic compression of the exiting L4 and L5 nerve roots bilaterally. Once the decompression of the stenotic compression of the thecal sac and exiting nerve roots was completed we proceeded with the posterior lumbar interbody arthrodesis. The annulus was incised bilaterally and the disc space entered. A thorough discectomy was performed using pituitary rongeurs and curettes. Once the discectomy was completed we began to prepare the endplate surfaces removing the cartilaginous endplates surface. We then measured the height of the intervertebral disc space. We selected 8 x 25 x 4 AVS peek interbody implants.  The C-arm fluoroscope was  then draped and brought in the field and we identified the pedicle entry points bilaterally at the L5 level. Each of the pedicles  was probed, we aspirated bone marrow aspirate from the vertebral bodies, this was injected over a 10 cc strip and a 5 cc strip of Vitoss BA. Then each of the pedicles was examined with the ball probe good bony surfaces were found and no bony cuts were found. Each of the pedicles was then tapped with a 5.25 mm tap, again examined with the ball probe good threading was found and no bony cuts were found. We then placed 5.75 by 50 millimeter screw on the left side, and a 5.75 x 45 mm screw on the right side.  We then packed the AVS peek interbody implants with Vitoss BA with bone marrow aspirate and infuse, and then placed the first implant on the right side, carefully retracting the thecal sac and nerve root medially. We then went back to the left side and packed the midline with additional Vitoss BA with bone marrow aspirate and infuse, and then placed a second implant and on the left side again retracting the thecal sac and nerve root medially. Additional Vitoss BA with bone marrow aspirate and infuse was packed lateral to the implants.  We then packed the lateral gutter over the transverse processes and intertransverse space with locally harvested morcellized autograft, Vitoss BA with bone marrow aspirate,. We then selected 90 mm pre-lordosed rods, they were placed within the screw heads and secured with locking caps once all 8 locking caps were placed final tightening was performed against a counter torque.  The wound had been irrigated multiple times during the procedure with saline solution and bacitracin solution, good hemostasis was established with a combination of bipolar cautery and Gelfoam with thrombin. Once good hemostasis was confirmed we proceeded with closure paraspinal muscles deep fascia and Scarpa's fascia were closed with interrupted undyed 1 Vicryl sutures the  subcutaneous and subcuticular closed with interrupted inverted 2-0 undyed Vicryl sutures the skin edges were approximated with Dermabond. The wound was dressed with sterile gauze and Hypafix  Following surgery the patient was turned back to the supine position to be reversed and the anesthetic extubated and transferred to the recovery room for further care.  PLAN OF CARE: Admit to inpatient   PATIENT DISPOSITION:  PACU - hemodynamically stable.   Delay start of Pharmacological VTE agent (>24hrs) due to surgical blood loss or risk of bleeding:  yes

## 2013-12-24 MED ORDER — CYCLOBENZAPRINE HCL 10 MG PO TABS: 5.0000 mg | ORAL_TABLET | Freq: Three times a day (TID) | ORAL | Status: DC | PRN

## 2013-12-24 MED ORDER — OXYCODONE-ACETAMINOPHEN 5-325 MG PO TABS: 1.0000 | ORAL_TABLET | ORAL | Status: DC | PRN

## 2013-12-24 NOTE — Discharge Summary (Signed)
Physician Discharge Summary  Patient ID: Calvin Marks MRN: [id number] DOB/AGE: 76-11-1935 76 y.o.  Admit date: 12/23/2013 Discharge date: 12/24/2013  Admission Diagnoses:  lumbar stenosis with neurogenic claudication, lumbar degenerative disc disease, lumbar spondylosis  Discharge Diagnoses:  lumbar stenosis with neurogenic claudication, lumbar degenerative disc disease, lumbar spondylosis  Active Problems:   Lumbar stenosis with neurogenic claudication   Discharged Condition: good  Hospital Course: Patient was admitted, underwent a bilateral L4-5 lumbar decompression, PLIF, and PLA. Postoperatively he has done well. He is up and ambulating in the halls. His Foley was discontinued and he is voiding well. His dressing was removed, and the incision is healing nicely. He is being discharged home with instructions regarding wound care and activities. He is to return for followup with me in 3 weeks.  Discharge Exam: Blood pressure 107/59, pulse 85, temperature 99.1 F (37.3 C), temperature source Oral, resp. rate 18, SpO2 95.00%.  Disposition: Home     Medication List         chlorpheniramine 4 MG tablet  Commonly known as:  CHLOR-TRIMETON  Take 4 mg by mouth 2 (two) times daily as needed. For allergies     cyclobenzaprine 10 MG tablet  Commonly known as:  FLEXERIL  Take 0.5-1 tablets (5-10 mg total) by mouth 3 (three) times daily as needed for muscle spasms.     cyclobenzaprine 10 MG tablet  Commonly known as:  FLEXERIL  Take 0.5-1 tablets (5-10 mg total) by mouth 3 (three) times daily as needed for muscle spasms.     finasteride 5 MG tablet  Commonly known as:  PROSCAR  Take 5 mg by mouth every morning.     fish oil-omega-3 fatty acids 1000 MG capsule  Take 1 g by mouth every morning.     HYDROcodone-acetaminophen 7.5-500 MG per tablet  Commonly known as:  LORTAB  Take 1 tablet by mouth every 4 (four) hours as needed. For pain     lansoprazole 30 MG capsule   Commonly known as:  PREVACID  Take 30 mg by mouth daily at 12 noon.     Melatonin 5 MG Caps  Take 5 mg by mouth at bedtime.     meloxicam 15 MG tablet  Commonly known as:  MOBIC  Take 15 mg by mouth daily.     metoprolol tartrate 25 MG tablet  Commonly known as:  LOPRESSOR  Take 25 mg by mouth every morning.     oxyCODONE-acetaminophen 5-325 MG per tablet  Commonly known as:  PERCOCET/ROXICET  Take 1-2 tablets by mouth every 4 (four) hours as needed for moderate pain.     polyethylene glycol packet  Commonly known as:  MIRALAX / GLYCOLAX  Take 9-17 g by mouth daily as needed (constipation).     traZODone 100 MG tablet  Commonly known as:  DESYREL  Take 100 mg by mouth at bedtime.     triamterene-hydrochlorothiazide 37.5-25 MG per tablet  Commonly known as:  MAXZIDE-25  Take 1 tablet by mouth every morning.         Signed: Hewitt Shortsobert W Nudelman, MD 12/24/2013, 4:14 PM

## 2013-12-24 NOTE — Discharge Instructions (Signed)
Wound Care °Leave incision open to air. °You may shower. °Do not scrub directly on incision.  °Do not put any creams, lotions, or ointments on incision. °Activity °Walk each and every day, increasing distance each day. °No lifting greater than 5 lbs.  Avoid bending, arching, and twisting. °No driving for 2 weeks; may ride as a passenger locally. °If provided with back brace, wear when out of bed.  It is not necessary to wear in bed. °Diet °Resume your normal diet.  °Return to Work °Will be discussed at you follow up appointment. °Call Your Doctor If Any of These Occur °Redness, drainage, or swelling at the wound.  °Temperature greater than 101 degrees. °Severe pain not relieved by pain medication. °Incision starts to come apart. °Follow Up Appt °Call today for appointment in 3 weeks ([phone]) or for problems.  If you have any hardware placed in your spine, you will need an x-ray before your appointment. ° ° ° ° °Spinal Fusion °Spinal fusion is a procedure to make 2 or more of the bones in your spinal column (vertebrae) grow together (fuse). This procedure stops movement between the vertebrae and can relieve pain and prevent deformity.  °Spinal fusion is used to treat the following conditions: °· Fractures of the spine. °· Herniated disk (the spongy material [cartilage] between the vertebrae). °· Abnormal curvatures of the spine, such as scoliosis or kyphosis. °· A weak or an unstable spine, caused by infections or tumor. °RISKS AND COMPLICATIONS °Complications associated with spinal fusion are rare, but they can occur. Possible complications include: °· Bleeding. °· Infection near the incision. °· Nerve damage. Signs of nerve damage are back pain, pain in one or both legs, weakness, or numbness. °· Spinal fluid leakage. °· Blood clot in your leg, which can move to your lungs. °· Difficulty controlling urination or bowel movements. °BEFORE THE PROCEDURE °· A medical evaluation will be done. This will include a  physical exam, blood tests, and imaging exams. °· You will talk with an anesthesiologist. This is the person who will be in charge of the anesthesia during the procedure. Spinal fusion usually requires that you are asleep during the procedure (general anesthesia). °· You will need to stop taking certain medicines, particularly those associated with an increased risk of bleeding. Ask your caregiver about changing or stopping your regular medicines. °· If you smoke, you will need to stop at least 2 weeks before the procedure. Smoking can slow down the healing process, especially fusion of the vertebrae, and increase the risk of complications. °· Do not eat or drink anything for at least 8 hours before the procedure. °PROCEDURE  °A cut (incision) is made over the vertebrae that will be fused. The back muscles are separated from the vertebrae. If you are having this procedure to treat a herniated disk, the disc material pressing on the nerve root is removed (decompression). The area where the disk is removed is then filled with extra bone. Bone from another part of your body (autogenous bone) or bone from a bone donor (allograft bone) may be used. The extra bone promotes fusion between the vertebrae. Sometimes, specific medicines are added to the fusion area to promote bone healing. In most cases, screws and rods or metal plates will be used to attach the vertebrae to stabilize them while they fuse.  °AFTER THE PROCEDURE  °· You will stay in a recovery area until the anesthesia has worn off. Your blood pressure and pulse will be checked frequently. °· You   will be given antibiotics to prevent infection. °· You may continue to receive fluids through an intravenous (IV) tube while you are still in the hospital. °· Pain after surgery is normal. You will be given pain medicine. °· You will be taught how to move correctly and how to stand and walk. While in bed, you will be instructed to turn frequently, using a "log rolling"  technique, in which the entire body is moved without twisting the back. °Document Released: 05/04/2003 Document Revised: 10/28/2011 Document Reviewed: 10/18/2010 °[employer]® Patient Information ©2014 [employer]. ° °  ° °

## 2013-12-24 NOTE — Progress Notes (Signed)
UR completed 

## 2013-12-24 NOTE — Progress Notes (Signed)
Pt doing well. Pt and wife given D/C instructions with Rx's, verbal understanding was given. Pt's IV was removed prior to D/C. Pt D/C'd home via wheelchair @ 1645 per MD order. Pt is stable @ D/C and has no other needs. Rema FendtAshley Joee Iovine, RN

## 2014-08-20 IMAGING — DX DG LUMBAR SPINE 1V
1 series · 1 of 1 positions shown · non-contrast
Comparison: None.

CLINICAL DATA: Lateral portable lumbar spine view following lumbar
fusion

EXAM:
LUMBAR SPINE - 1 VIEW

[lat]
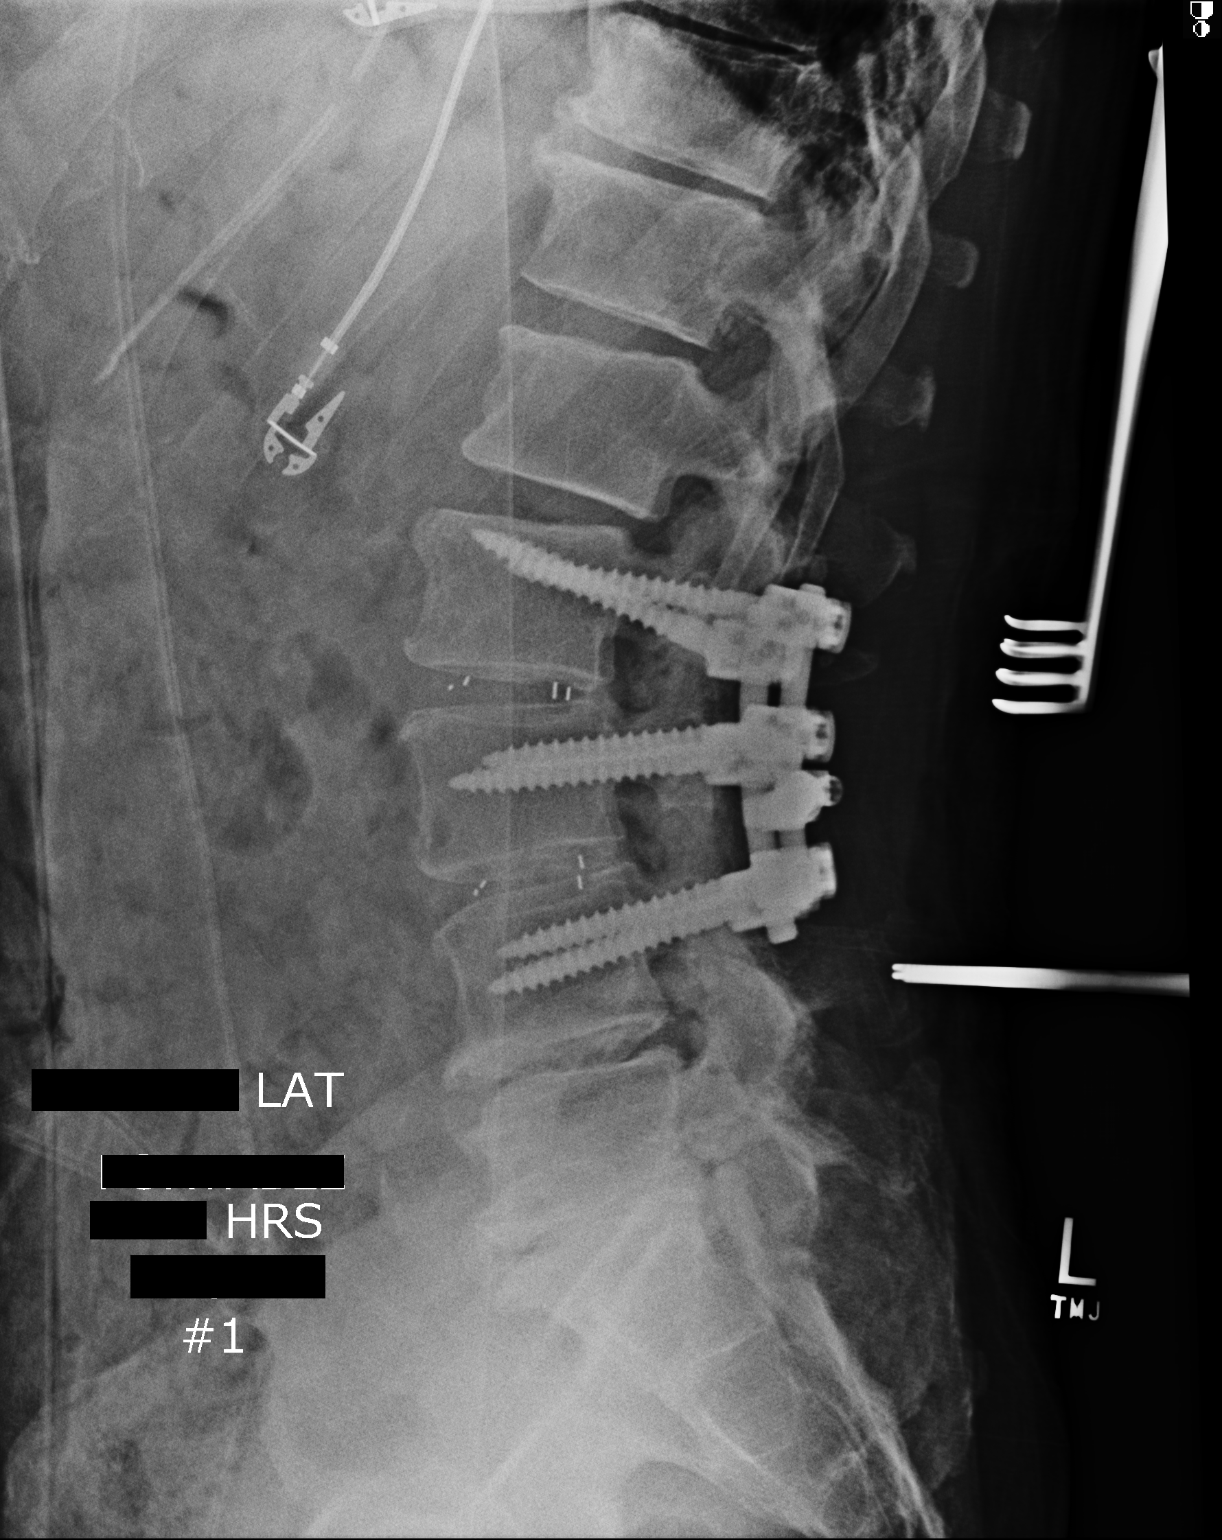

[1 of 1 positions shown; findings below may reference images not displayed]

FINDINGS: Pedicle screws have been inserted at L2, L3 and L4. The pedicle
screws are well-seated. There are any connecting rods.
Well-positioned radiolucent disc spacers maintained disc height at
the fused levels.

There are significant disk degenerative changes at L4-L5 and L5-S1.

No evidence of an operative complication.
IMPRESSION: Lateral lumbar spine image for surgical localization following L2
through L4 fusion.

## 2017-07-08 ENCOUNTER — Other Ambulatory Visit: Payer: Self-pay | Admitting: Internal Medicine

## 2017-07-08 DIAGNOSIS — R011 Cardiac murmur, unspecified: Secondary | ICD-10-CM

## 2017-07-09 ENCOUNTER — Other Ambulatory Visit: Payer: Self-pay

## 2017-07-09 ENCOUNTER — Ambulatory Visit (HOSPITAL_COMMUNITY): Payer: Medicare Other | Attending: Internal Medicine

## 2017-07-09 DIAGNOSIS — I5030 Unspecified diastolic (congestive) heart failure: Secondary | ICD-10-CM | POA: Insufficient documentation

## 2017-07-09 DIAGNOSIS — R011 Cardiac murmur, unspecified: Secondary | ICD-10-CM

## 2017-07-09 DIAGNOSIS — I071 Rheumatic tricuspid insufficiency: Secondary | ICD-10-CM | POA: Diagnosis not present

## 2019-10-14 ENCOUNTER — Ambulatory Visit: Payer: Medicare Other | Attending: Internal Medicine

## 2019-10-14 DIAGNOSIS — Z23 Encounter for immunization: Secondary | ICD-10-CM | POA: Insufficient documentation

## 2019-10-14 NOTE — Progress Notes (Signed)
   Covid-19 Vaccination Clinic  Name:  Calvin Marks    MRN: [id number] DOB: 1936/08/10  10/14/2019  Mr. Calvin Marks was observed post Covid-19 immunization for 15 minutes without incidence. He was provided with Vaccine Information Sheet and instruction to access the V-Safe system.   Mr. Calvin Marks was instructed to call 911 with any severe reactions post vaccine: [name] Kitchen Difficulty breathing  . Swelling of your face and throat  . A fast heartbeat  . A bad rash all over your body  . Dizziness and weakness    Immunizations Administered    Name Date Dose VIS Date Route   Pfizer COVID-19 Vaccine 10/14/2019 12:34 PM 0.3 mL 07/30/2019 Intramuscular   Manufacturer: [employer], [employer]   Lot: [id number]   NDC: [address]-0

## 2019-11-03 ENCOUNTER — Ambulatory Visit: Payer: Medicare Other | Attending: Internal Medicine

## 2019-11-03 DIAGNOSIS — Z23 Encounter for immunization: Secondary | ICD-10-CM

## 2019-11-03 NOTE — Progress Notes (Signed)
   Covid-19 Vaccination Clinic  Name:  Calvin Marks    MRN: [id number] DOB: 11-24-1935  11/03/2019  Calvin Marks was observed post Covid-19 immunization for 15 minutes without incident. He was provided with Vaccine Information Sheet and instruction to access the V-Safe system.   Calvin Marks was instructed to call 911 with any Marks reactions post vaccine: [name] Kitchen Difficulty breathing  . Swelling of face and throat  . A fast heartbeat  . A bad rash all over body  . Dizziness and weakness   Immunizations Administered    Name Date Dose VIS Date Route   Pfizer COVID-19 Vaccine 11/03/2019  1:46 PM 0.3 mL 07/30/2019 Intramuscular   Manufacturer: [employer], [employer]   Lot: [id number]   NDC: [address]-7

## 2022-11-27 ENCOUNTER — Other Ambulatory Visit: Payer: Self-pay | Admitting: Internal Medicine

## 2022-11-27 DIAGNOSIS — R0989 Other specified symptoms and signs involving the circulatory and respiratory systems: Secondary | ICD-10-CM

## 2022-12-25 ENCOUNTER — Other Ambulatory Visit: Payer: Federal, State, Local not specified - PPO

## 2023-01-15 ENCOUNTER — Other Ambulatory Visit: Payer: Federal, State, Local not specified - PPO

## 2023-02-21 ENCOUNTER — Emergency Department (HOSPITAL_COMMUNITY): Payer: Medicare Other

## 2023-02-21 ENCOUNTER — Inpatient Hospital Stay (HOSPITAL_COMMUNITY)
Admission: EM | Admit: 2023-02-21 | Discharge: 2023-02-27 | DRG: 280 | Disposition: A | Discharge status: Home / Self Care | Payer: Medicare Other | Attending: Internal Medicine | Admitting: Internal Medicine

## 2023-02-21 ENCOUNTER — Inpatient Hospital Stay (HOSPITAL_COMMUNITY): Payer: Medicare Other

## 2023-02-21 ENCOUNTER — Encounter (HOSPITAL_COMMUNITY): Payer: Self-pay

## 2023-02-21 ENCOUNTER — Other Ambulatory Visit: Payer: Self-pay

## 2023-02-21 DIAGNOSIS — Z881 Allergy status to other antibiotic agents status: Secondary | ICD-10-CM | POA: Diagnosis not present

## 2023-02-21 DIAGNOSIS — Z87891 Personal history of nicotine dependence: Secondary | ICD-10-CM | POA: Diagnosis not present

## 2023-02-21 DIAGNOSIS — I252 Old myocardial infarction: Secondary | ICD-10-CM

## 2023-02-21 DIAGNOSIS — I214 Non-ST elevation (NSTEMI) myocardial infarction: Principal | ICD-10-CM | POA: Diagnosis present

## 2023-02-21 DIAGNOSIS — E785 Hyperlipidemia, unspecified: Secondary | ICD-10-CM | POA: Diagnosis present

## 2023-02-21 DIAGNOSIS — Z7982 Long term (current) use of aspirin: Secondary | ICD-10-CM | POA: Diagnosis not present

## 2023-02-21 DIAGNOSIS — I2510 Atherosclerotic heart disease of native coronary artery without angina pectoris: Secondary | ICD-10-CM | POA: Diagnosis not present

## 2023-02-21 DIAGNOSIS — R918 Other nonspecific abnormal finding of lung field: Secondary | ICD-10-CM

## 2023-02-21 DIAGNOSIS — Z79899 Other long term (current) drug therapy: Secondary | ICD-10-CM | POA: Diagnosis not present

## 2023-02-21 DIAGNOSIS — N400 Enlarged prostate without lower urinary tract symptoms: Secondary | ICD-10-CM | POA: Diagnosis present

## 2023-02-21 DIAGNOSIS — I130 Hypertensive heart and chronic kidney disease with heart failure and stage 1 through stage 4 chronic kidney disease, or unspecified chronic kidney disease: Secondary | ICD-10-CM | POA: Diagnosis present

## 2023-02-21 DIAGNOSIS — I5021 Acute systolic (congestive) heart failure: Secondary | ICD-10-CM | POA: Diagnosis not present

## 2023-02-21 DIAGNOSIS — N289 Disorder of kidney and ureter, unspecified: Secondary | ICD-10-CM

## 2023-02-21 DIAGNOSIS — M7989 Other specified soft tissue disorders: Secondary | ICD-10-CM | POA: Diagnosis not present

## 2023-02-21 DIAGNOSIS — Z1152 Encounter for screening for COVID-19: Secondary | ICD-10-CM | POA: Diagnosis not present

## 2023-02-21 DIAGNOSIS — Z888 Allergy status to other drugs, medicaments and biological substances status: Secondary | ICD-10-CM

## 2023-02-21 DIAGNOSIS — M549 Dorsalgia, unspecified: Secondary | ICD-10-CM | POA: Diagnosis present

## 2023-02-21 DIAGNOSIS — G8929 Other chronic pain: Secondary | ICD-10-CM | POA: Diagnosis present

## 2023-02-21 DIAGNOSIS — F32A Depression, unspecified: Secondary | ICD-10-CM | POA: Diagnosis present

## 2023-02-21 DIAGNOSIS — N1832 Chronic kidney disease, stage 3b: Secondary | ICD-10-CM | POA: Diagnosis present

## 2023-02-21 DIAGNOSIS — Z791 Long term (current) use of non-steroidal anti-inflammatories (NSAID): Secondary | ICD-10-CM

## 2023-02-21 DIAGNOSIS — J189 Pneumonia, unspecified organism: Secondary | ICD-10-CM | POA: Diagnosis present

## 2023-02-21 DIAGNOSIS — I429 Cardiomyopathy, unspecified: Secondary | ICD-10-CM | POA: Diagnosis not present

## 2023-02-21 DIAGNOSIS — I10 Essential (primary) hypertension: Secondary | ICD-10-CM | POA: Diagnosis not present

## 2023-02-21 DIAGNOSIS — I472 Ventricular tachycardia, unspecified: Secondary | ICD-10-CM | POA: Diagnosis present

## 2023-02-21 DIAGNOSIS — K219 Gastro-esophageal reflux disease without esophagitis: Secondary | ICD-10-CM | POA: Diagnosis present

## 2023-02-21 DIAGNOSIS — Z01810 Encounter for preprocedural cardiovascular examination: Secondary | ICD-10-CM | POA: Diagnosis not present

## 2023-02-21 DIAGNOSIS — I255 Ischemic cardiomyopathy: Secondary | ICD-10-CM | POA: Diagnosis present

## 2023-02-21 LAB — COMPREHENSIVE METABOLIC PANEL
ALT: 12 U/L (ref 0–44)
AST: 22 U/L (ref 15–41)
Albumin: 4.4 g/dL (ref 3.5–5.0)
Alkaline Phosphatase: 79 U/L (ref 38–126)
Anion gap: 10 (ref 5–15)
BUN: 26 mg/dL — ABNORMAL HIGH (ref 8–23)
CO2: 23 mmol/L (ref 22–32)
Calcium: 9.5 mg/dL (ref 8.9–10.3)
Chloride: 105 mmol/L (ref 98–111)
Creatinine, Ser: 1.62 mg/dL — ABNORMAL HIGH (ref 0.61–1.24)
GFR, Estimated: 42 mL/min — ABNORMAL LOW (ref >60)
Glucose, Bld: 100 mg/dL — ABNORMAL HIGH (ref 70–99)
Potassium: 3.8 mmol/L (ref 3.5–5.1)
Sodium: 138 mmol/L (ref 135–145)
Total Bilirubin: 0.6 mg/dL (ref 0.3–1.2)
Total Protein: 8 g/dL (ref 6.5–8.1)

## 2023-02-21 LAB — RESPIRATORY PANEL BY PCR

## 2023-02-21 LAB — URINALYSIS, COMPLETE (UACMP) WITH MICROSCOPIC
Bacteria, UA: NONE SEEN
Bilirubin Urine: NEGATIVE
Glucose, UA: NEGATIVE mg/dL
Hgb urine dipstick: NEGATIVE
Ketones, ur: NEGATIVE mg/dL
Leukocytes,Ua: NEGATIVE
Nitrite: NEGATIVE
Protein, ur: NEGATIVE mg/dL
Specific Gravity, Urine: 1.016 (ref 1.005–1.030)
pH: 6 (ref 5.0–8.0)

## 2023-02-21 LAB — SARS CORONAVIRUS 2 BY RT PCR: SARS Coronavirus 2 by RT PCR: NEGATIVE

## 2023-02-21 LAB — BRAIN NATRIURETIC PEPTIDE: B Natriuretic Peptide: 205.8 pg/mL — ABNORMAL HIGH (ref 0.0–100.0)

## 2023-02-21 LAB — CREATININE, URINE, RANDOM: Creatinine, Urine: 106 mg/dL

## 2023-02-21 LAB — CBC
HCT: 41.9 % (ref 39.0–52.0)
Hemoglobin: 13.9 g/dL (ref 13.0–17.0)
MCH: 30.3 pg (ref 26.0–34.0)
MCHC: 33.2 g/dL (ref 30.0–36.0)
MCV: 91.5 fL (ref 80.0–100.0)
Platelets: 259 10*3/uL (ref 150–400)
RBC: 4.58 MIL/uL (ref 4.22–5.81)
RDW: 13.6 % (ref 11.5–15.5)
WBC: 7.3 10*3/uL (ref 4.0–10.5)
nRBC: 0 % (ref 0.0–0.2)

## 2023-02-21 LAB — SODIUM, URINE, RANDOM: Sodium, Ur: 130 mmol/L

## 2023-02-21 LAB — D-DIMER, QUANTITATIVE: D-Dimer, Quant: 0.46 ug/mL-FEU (ref 0.00–0.50)

## 2023-02-21 LAB — TROPONIN I (HIGH SENSITIVITY)
Troponin I (High Sensitivity): 415 ng/L (ref <18)
Troponin I (High Sensitivity): 306 ng/L (ref <18)

## 2023-02-21 MED ORDER — SODIUM CHLORIDE 0.9 % IV SOLN
500.0000 mg | Freq: Every day | INTRAVENOUS | Status: DC
  Administered 2023-02-21: 500 mg via INTRAVENOUS
  Filled 2023-02-21 (×2): qty 5

## 2023-02-21 MED ORDER — HEPARIN (PORCINE) 25000 UT/250ML-% IV SOLN
1050.0000 [IU]/h | INTRAVENOUS | Status: DC
  Administered 2023-02-21: 850 [IU]/h via INTRAVENOUS
  Administered 2023-02-22 – 2023-02-23 (×2): 1050 [IU]/h via INTRAVENOUS
  Filled 2023-02-21 (×3): qty 250

## 2023-02-21 MED ORDER — OMEGA-3-ACID ETHYL ESTERS 1 G PO CAPS
1.0000 g | ORAL_CAPSULE | Freq: Every day | ORAL | Status: DC
  Administered 2023-02-22 – 2023-02-27 (×6): 1 g via ORAL
  Filled 2023-02-21 (×6): qty 1

## 2023-02-21 MED ORDER — HEPARIN BOLUS VIA INFUSION
4000.0000 [IU] | Freq: Once | INTRAVENOUS | Status: AC
  Administered 2023-02-21: 4000 [IU] via INTRAVENOUS
  Filled 2023-02-21: qty 4000

## 2023-02-21 MED ORDER — PANTOPRAZOLE SODIUM 20 MG PO TBEC
20.0000 mg | DELAYED_RELEASE_TABLET | Freq: Every day | ORAL | Status: DC
  Administered 2023-02-21 – 2023-02-27 (×7): 20 mg via ORAL
  Filled 2023-02-21 (×7): qty 1

## 2023-02-21 MED ORDER — ACETAMINOPHEN 325 MG PO TABS: 650.0000 mg | ORAL_TABLET | Freq: Four times a day (QID) | ORAL | Status: DC | PRN

## 2023-02-21 MED ORDER — ASPIRIN 81 MG PO CHEW
81.0000 mg | CHEWABLE_TABLET | Freq: Every day | ORAL | Status: DC
  Administered 2023-02-22 – 2023-02-27 (×5): 81 mg via ORAL
  Filled 2023-02-21 (×6): qty 1

## 2023-02-21 MED ORDER — MELATONIN 5 MG PO TABS
10.0000 mg | ORAL_TABLET | Freq: Every day | ORAL | Status: DC
  Administered 2023-02-21 – 2023-02-25 (×5): 10 mg via ORAL
  Filled 2023-02-21 (×7): qty 2

## 2023-02-21 MED ORDER — POLYETHYLENE GLYCOL 3350 17 G PO PACK: 17.0000 g | PACK | Freq: Every day | ORAL | Status: DC | PRN

## 2023-02-21 MED ORDER — SODIUM CHLORIDE 0.9 % IV SOLN
1.0000 g | Freq: Every day | INTRAVENOUS | Status: DC
  Administered 2023-02-21 – 2023-02-22 (×2): 1 g via INTRAVENOUS
  Filled 2023-02-21 (×2): qty 10

## 2023-02-21 MED ORDER — HYDROCODONE-ACETAMINOPHEN 7.5-325 MG PO TABS
1.0000 | ORAL_TABLET | Freq: Four times a day (QID) | ORAL | Status: DC
  Administered 2023-02-21 – 2023-02-27 (×22): 1 via ORAL
  Filled 2023-02-21 (×23): qty 1

## 2023-02-21 MED ORDER — SODIUM CHLORIDE 0.9 % IV SOLN: INTRAVENOUS | Status: DC

## 2023-02-21 MED ORDER — FINASTERIDE 5 MG PO TABS
5.0000 mg | ORAL_TABLET | Freq: Every morning | ORAL | Status: DC
  Administered 2023-02-22 – 2023-02-27 (×6): 5 mg via ORAL
  Filled 2023-02-21 (×6): qty 1

## 2023-02-21 MED ORDER — ASPIRIN 81 MG PO CHEW
324.0000 mg | CHEWABLE_TABLET | Freq: Once | ORAL | Status: AC
  Administered 2023-02-21: 324 mg via ORAL
  Filled 2023-02-21: qty 4

## 2023-02-21 MED ORDER — LABETALOL HCL 5 MG/ML IV SOLN: 20.0000 mg | INTRAVENOUS | Status: DC | PRN

## 2023-02-21 MED ORDER — ACETAMINOPHEN 650 MG RE SUPP: 650.0000 mg | Freq: Four times a day (QID) | RECTAL | Status: DC | PRN

## 2023-02-21 MED ORDER — POLYETHYLENE GLYCOL 3350 17 G PO PACK
8.5000 g | PACK | Freq: Every day | ORAL | Status: DC
  Administered 2023-02-22 – 2023-02-23 (×2): 8.5 g via ORAL
  Filled 2023-02-21: qty 1

## 2023-02-21 MED ORDER — METOPROLOL TARTRATE 12.5 MG HALF TABLET
12.5000 mg | ORAL_TABLET | Freq: Two times a day (BID) | ORAL | Status: DC
  Administered 2023-02-21 – 2023-02-23 (×5): 12.5 mg via ORAL
  Filled 2023-02-21 (×5): qty 1

## 2023-02-21 MED ORDER — MAGNESIUM OXIDE -MG SUPPLEMENT 400 (240 MG) MG PO TABS
400.0000 mg | ORAL_TABLET | Freq: Every day | ORAL | Status: DC
  Administered 2023-02-21 – 2023-02-26 (×6): 400 mg via ORAL
  Filled 2023-02-21 (×6): qty 1

## 2023-02-21 MED ORDER — SODIUM CHLORIDE 0.9% FLUSH
3.0000 mL | Freq: Two times a day (BID) | INTRAVENOUS | Status: DC
  Administered 2023-02-21 – 2023-02-27 (×10): 3 mL via INTRAVENOUS

## 2023-02-21 MED ORDER — ROSUVASTATIN CALCIUM 5 MG PO TABS
10.0000 mg | ORAL_TABLET | Freq: Every day | ORAL | Status: DC
  Administered 2023-02-21 – 2023-02-25 (×5): 10 mg via ORAL
  Filled 2023-02-21 (×5): qty 2

## 2023-02-21 MED ORDER — OMEGA-3 FATTY ACIDS 1000 MG PO CAPS: 1.0000 g | ORAL_CAPSULE | Freq: Every morning | ORAL | Status: DC

## 2023-02-21 MED ORDER — IRBESARTAN 300 MG PO TABS: 300.0000 mg | ORAL_TABLET | Freq: Every day | ORAL | Status: DC

## 2023-02-21 MED ORDER — LACTATED RINGERS IV SOLN: INTRAVENOUS | Status: DC

## 2023-02-21 MED ORDER — TRAZODONE HCL 100 MG PO TABS
100.0000 mg | ORAL_TABLET | Freq: Every day | ORAL | Status: DC
  Administered 2023-02-21 – 2023-02-26 (×6): 100 mg via ORAL
  Filled 2023-02-21 (×6): qty 1

## 2023-02-21 NOTE — Plan of Care (Signed)

## 2023-02-21 NOTE — Assessment & Plan Note (Signed)
Patient describes symptoms of stable angina for the last 1 month, however has had chest pain at rest for the last 2 out of 3 nights, with no troponin elevation.  Most concerning for NSTEMI.  I have ordered the NSTEMI order set, including aspirin, metoprolol, heparin infusion ordered by ER attending will be continued.  Patient has allergy reaction to pravastatin, that seems to be high risk medication for patient, therefore at this time not ordering atorvastatin.  Given finding of left chest crackles infiltrate in that area as well as troponin elevation, venous thromboembolism is in the differential diagnosis.  I will check D-dimer and lower extremity Dopplers.  Check BNP as well.

## 2023-02-21 NOTE — H&P (Addendum)
History and Physical    Patient: Calvin Marks ZOX:[id number] DOB: [birth year] DOA: 02/21/2023 DOS: the patient was seen and examined on 02/21/2023 PCP: Renford Dills, MD  Patient coming from: Home  Chief Complaint:  Chief Complaint  Patient presents with   Chest Pain   Shortness of Breath   HPI: Calvin Marks is a 87 y.o. male with medical history significant of hypertension.  Patient was in his usual state of health till about a month ago when he reports a new onset of left-sided lower chest discomfort anteriorly whenever he would "exert himself "such as pushing the lawnmower.  The discomfort would go away with resting.  There was some questionable association with shortness of breath at the time which would also resolved with rest.  There is no fever no coughing no leg swelling or cramping.  This has persisted for the last 1 month, however for Tuesday night and Wednesday night which was 3 nights ago and 2 nights ago patient woke up in the middle of the night with left arm discomfort.  Patient subsequently stopped it did off.  Last night patient took an aspirin and then went to bed, and had no recurrence of the arm discomfort.  However patient had persistent concern of his health/heart status and therefore came to the ER today.  Patient is completely asymptomatic at the time of this evaluation.  There is no report of palpitation presyncope or loss of consciousness.  Patient does not smoke anymore. Review of Systems: As mentioned in the history of present illness. All other systems reviewed and are negative. Past Medical History:  Diagnosis Date   Arthritis    Constipation    Depression    Enlarged prostate    Environmental allergies    GERD (gastroesophageal reflux disease)    Hypertension    Past Surgical History:  Procedure Laterality Date   BACK SURGERY     COLONOSCOPY     DENTAL SURGERY     Social History:  reports that he has never smoked. He does not have any smokeless  tobacco history on file. He reports current alcohol use. He reports that he does not use drugs.  Allergies  Allergen Reactions   Lisinopril Other (See Comments)    Caused trouble urinating and soreness in the lower abdomen   Pravastatin Other (See Comments)    Leg cramps   Simvastatin Other (See Comments)    Welts, fever, headaches   Ciprofloxacin Hives and Rash    History reviewed. No pertinent family history.  Prior to Admission medications   Medication Sig Start Date End Date Taking? Authorizing Provider  amLODipine (NORVASC) 2.5 MG tablet Take 2.5 mg by mouth in the morning.   Yes [provider]  finasteride (PROSCAR) 5 MG tablet Take 5 mg by mouth every morning.   Yes [provider]  fish oil-omega-3 fatty acids 1000 MG capsule Take 1 g by mouth every morning.   Yes [provider]  HYDROcodone-acetaminophen (NORCO) 7.5-325 MG tablet Take 1 tablet by mouth 4 (four) times daily.   Yes [provider]  lansoprazole (PREVACID) 30 MG capsule Take 30 mg by mouth daily in the afternoon.   Yes [provider]  magnesium oxide (MAG-OX) 400 (240 Mg) MG tablet Take 400 mg by mouth at bedtime.   Yes [provider]  Melatonin 10 MG TABS Take 10 mg by mouth at bedtime.   Yes [provider]  meloxicam (MOBIC) 15 MG tablet Take 15  mg by mouth daily.   Yes [provider]  polyethylene glycol (MIRALAX / GLYCOLAX) packet Take 8.5 g by mouth in the morning.   Yes [provider]  telmisartan-hydrochlorothiazide (MICARDIS HCT) 80-25 MG tablet Take 1 tablet by mouth in the morning.   Yes [provider]  traZODone (DESYREL) 100 MG tablet Take 100 mg by mouth at bedtime.   Yes [provider]  cyclobenzaprine (FLEXERIL) 10 MG tablet Take 0.5-1 tablets (5-10 mg total) by mouth 3 (three) times daily as needed for muscle spasms. Patient not taking: Reported on 02/21/2023 09/04/12   Shirlean Kelly, MD   cyclobenzaprine (FLEXERIL) 10 MG tablet Take 0.5-1 tablets (5-10 mg total) by mouth 3 (three) times daily as needed for muscle spasms. Patient not taking: Reported on 02/21/2023 12/24/13   Shirlean Kelly, MD  oxyCODONE-acetaminophen (PERCOCET/ROXICET) 5-325 MG per tablet Take 1-2 tablets by mouth every 4 (four) hours as needed for moderate pain. Patient not taking: Reported on 02/21/2023 12/24/13   Shirlean Kelly, MD    Physical Exam: Vitals:   02/21/23 1414 02/21/23 1419 02/21/23 1645  BP:  121/74 (!) 145/72  Pulse:  84 71  Resp:  11 14  Temp:  98.2 F (36.8 C)   SpO2:  97% 98%  Weight: 73.6 kg    Height: 5\' 5"  (1.651 m)     General: Thin gentleman does not appear to be in any distress Respiratory exam: Left basilar fine crackles are heard less on the right basilar aspect Cardiovascular exam S1-S2 normal Abdomen soft nontender Extremities warm without edema Patient is alert awake oriented x 3 no focal motor deficit Data Reviewed:  Labs on Admission:  Results for orders placed or performed during the hospital encounter of 02/21/23 (from the past 24 hour(s))  CBC     Status: None   Collection Time: 02/21/23  2:28 PM  Result Value Ref Range   WBC 7.3 4.0 - 10.5 K/uL   RBC 4.58 4.22 - 5.81 MIL/uL   Hemoglobin 13.9 13.0 - 17.0 g/dL   HCT 16.1 09.6 - 04.5 %   MCV 91.5 80.0 - 100.0 fL   MCH 30.3 26.0 - 34.0 pg   MCHC 33.2 30.0 - 36.0 g/dL   RDW 40.9 81.1 - 91.4 %   Platelets 259 150 - 400 K/uL   nRBC 0.0 0.0 - 0.2 %  Troponin I (High Sensitivity)     Status: Abnormal   Collection Time: 02/21/23  2:28 PM  Result Value Ref Range   Troponin I (High Sensitivity) 415 (HH) <18 ng/L  Comprehensive metabolic panel     Status: Abnormal   Collection Time: 02/21/23  2:28 PM  Result Value Ref Range   Sodium 138 135 - 145 mmol/L   Potassium 3.8 3.5 - 5.1 mmol/L   Chloride 105 98 - 111 mmol/L   CO2 23 22 - 32 mmol/L   Glucose, Bld 100 (H) 70 - 99 mg/dL   BUN 26 (H) 8 - 23 mg/dL    Creatinine, Ser 7.82 (H) 0.61 - 1.24 mg/dL   Calcium 9.5 8.9 - 95.6 mg/dL   Total Protein 8.0 6.5 - 8.1 g/dL   Albumin 4.4 3.5 - 5.0 g/dL   AST 22 15 - 41 U/L   ALT 12 0 - 44 U/L   Alkaline Phosphatase 79 38 - 126 U/L   Total Bilirubin 0.6 0.3 - 1.2 mg/dL   GFR, Estimated 42 (L) >60 mL/min   Anion gap 10 5 - 15  Basic Metabolic Panel: Recent Labs  Lab 02/21/23 1428  NA 138  K 3.8  CL 105  CO2 23  GLUCOSE 100*  BUN 26*  CREATININE 1.62*  CALCIUM 9.5   Liver Function Tests: Recent Labs  Lab 02/21/23 1428  AST 22  ALT 12  ALKPHOS 79  BILITOT 0.6  PROT 8.0  ALBUMIN 4.4   No results for input(s): "LIPASE", "AMYLASE" in the last 168 hours. No results for input(s): "AMMONIA" in the last 168 hours. CBC: Recent Labs  Lab 02/21/23 1428  WBC 7.3  HGB 13.9  HCT 41.9  MCV 91.5  PLT 259   Cardiac Enzymes: Recent Labs  Lab 02/21/23 1428  TROPONINIHS 415*    BNP (last 3 results) No results for input(s): "PROBNP" in the last 8760 hours. CBG: No results for input(s): "GLUCAP" in the last 168 hours.  Radiological Exams on Admission:  DG Chest 2 View  Result Date: 02/21/2023 CLINICAL DATA:  Shortness of breath EXAM: CHEST - 2 VIEW COMPARISON:  X-ray 12/13/2013 FINDINGS: No pneumothorax or effusion. Slight opacity at the left lung base. New from previous. Acute process is possible. Underlying chronic lung changes. No pneumothorax or edema. Normal cardiopericardial silhouette. Calcified aorta. Overlapping cardiac leads. Degenerative changes of the spine. Fixation hardware in the lumbar spine region at the edge of the imaging field. IMPRESSION: Subtle opacity left lung base new from previous. An acute infiltrate is possible. Recommend follow-up. Underlying chronic changes as well. Electronically Signed   By: Karen Kays M.D.   On: 02/21/2023 15:05    EKG: Independently reviewed. NSR with PVC   Assessment and Plan: * NSTEMI (non-ST elevated myocardial infarction)  (HCC) Patient describes symptoms of stable angina for the last 1 month, however has had chest pain at rest for the last 2 out of 3 nights, with no troponin elevation.  Most concerning for NSTEMI.  I have ordered the NSTEMI order set, including aspirin, metoprolol, heparin infusion ordered by ER attending will be continued.  Patient has allergy reaction to pravastatin, that seems to be high risk medication for patient, therefore at this time not ordering atorvastatin.  Given finding of left chest crackles infiltrate in that area as well as troponin elevation, venous thromboembolism is in the differential diagnosis.  I will check D-dimer and lower extremity Dopplers.  Check BNP as well.  Kidney disease Unknown acuity of creatinine elevation.  Patient does not report any lower urinary tract symptoms.  I will check kidney ultrasound, urinalysis, sodium creatinine.  Intake output and daily weights.  Empiric hydration, trend creatinine.  Lung infiltrate Check lower extremity Dopplers and D-dimer to rule out venous thromboembolism.  Treat with ceftriaxone and azithromycin, pending COVID-19 and respiratory viral panel   Patient medication reconciliation is now complete.  I have held some of the patient's antihypertensive medication including amlodipine and hydrochlorothiazide with plan to use beta-blocker instead.  In view of patient's chronic kidney disease I have continued ARB.   Advance Care Planning:   Code Status: Full Code   Consults: Cardiology has been consulted by the ER attending, they will see the patient in [facility] where they have requested the patient to arrive.  Family Communication: As per patient  Severity of Illness: The appropriate patient status for this patient is INPATIENT. Inpatient status is judged to be reasonable and necessary in order to provide the required intensity of service to ensure the patient's safety. The patient's presenting symptoms, physical exam  findings, and initial radiographic and  laboratory data in the context of their chronic comorbidities is felt to place them at high risk for further clinical deterioration. Furthermore, it is not anticipated that the patient will be medically stable for discharge from the hospital within 2 midnights of admission.   * I certify that at the point of admission it is my clinical judgment that the patient will require inpatient hospital care spanning beyond 2 midnights from the point of admission due to high intensity of service, high risk for further deterioration and high frequency of surveillance required.*  Author: Nolberto Hanlon, MD 02/21/2023 5:26 PM  For on call review [email or web address].

## 2023-02-21 NOTE — Assessment & Plan Note (Signed)
Unknown acuity of creatinine elevation.  Patient does not report any lower urinary tract symptoms.  I will check kidney ultrasound, urinalysis, sodium creatinine.  Intake output and daily weights.  Empiric hydration, trend creatinine.

## 2023-02-21 NOTE — Assessment & Plan Note (Signed)
Check lower extremity Dopplers and D-dimer to rule out venous thromboembolism.  Treat with ceftriaxone and azithromycin, pending COVID-19 and respiratory viral panel

## 2023-02-21 NOTE — ED Triage Notes (Signed)
Patient reports left sided chest pain and SOB x 1 month. Patient reports 3 nights ago pain started to radiate into his left arm. Pain is worse with movement. Patient denies nausea and vomiting. Patient has hx of high blood pressure.

## 2023-02-21 NOTE — ED Notes (Addendum)
Report was called to Marylene Land, Charity fundraiser on [address] [place] at [employer]

## 2023-02-21 NOTE — ED Provider Notes (Addendum)
EMERGENCY DEPARTMENT AT [facility] Provider Note   CSN: [id number] Arrival date & time: 02/21/23  1409     History  Chief Complaint  Patient presents with   Chest Pain   Shortness of Breath    Calvin Marks is a 87 y.o. male.   Chest Pain Associated symptoms: shortness of breath   Shortness of Breath Associated symptoms: chest pain   Patient presents with chest pain.  Anterior chest.  This around for a month.  Left lower chest when he exerts himself.  States he attempts to mow the lawn he gets the pain there.  Goes away with rest.  Does have some shortness of breath at times when it happens.  States the last 2 days he woke up with pain in his left arm.  States he is able to walk his dog without pain but mowing did cause the pain.  States he thinks he needs a Roto-Rooter to his heart.  No fevers.  No cough.  No shortness of breath except when he gets the pain.     Past Medical History:  Diagnosis Date   Arthritis    Constipation    Depression    Enlarged prostate    Environmental allergies    GERD (gastroesophageal reflux disease)    Hypertension    Patient Home Medications Prior to Admission medications   Medication Sig Start Date End Date Taking? Authorizing Provider  amLODipine (NORVASC) 2.5 MG tablet Take 2.5 mg by mouth in the morning.   Yes [provider]  finasteride (PROSCAR) 5 MG tablet Take 5 mg by mouth every morning.   Yes [provider]  fish oil-omega-3 fatty acids 1000 MG capsule Take 1 g by mouth every morning.   Yes [provider]  HYDROcodone-acetaminophen (NORCO) 7.5-325 MG tablet Take 1 tablet by mouth 4 (four) times daily.   Yes [provider]  lansoprazole (PREVACID) 30 MG capsule Take 30 mg by mouth daily in the afternoon.   Yes [provider]  magnesium oxide (MAG-OX) 400 (240 Mg) MG tablet Take 400 mg by mouth at bedtime.   Yes [provider]  Melatonin 10 MG  TABS Take 10 mg by mouth at bedtime.   Yes [provider]  meloxicam (MOBIC) 15 MG tablet Take 15 mg by mouth daily.   Yes [provider]  polyethylene glycol (MIRALAX / GLYCOLAX) packet Take 8.5 g by mouth in the morning.   Yes [provider]  telmisartan-hydrochlorothiazide (MICARDIS HCT) 80-25 MG tablet Take 1 tablet by mouth in the morning.   Yes [provider]  traZODone (DESYREL) 100 MG tablet Take 100 mg by mouth at bedtime.   Yes [provider]  cyclobenzaprine (FLEXERIL) 10 MG tablet Take 0.5-1 tablets (5-10 mg total) by mouth 3 (three) times daily as needed for muscle spasms. Patient not taking: Reported on 02/21/2023 09/04/12   Shirlean Kelly, MD  cyclobenzaprine (FLEXERIL) 10 MG tablet Take 0.5-1 tablets (5-10 mg total) by mouth 3 (three) times daily as needed for muscle spasms. Patient not taking: Reported on 02/21/2023 12/24/13   Shirlean Kelly, MD  oxyCODONE-acetaminophen (PERCOCET/ROXICET) 5-325 MG per tablet Take 1-2 tablets by mouth every 4 (four) hours as needed for moderate pain. Patient not taking: Reported on 02/21/2023 12/24/13   Shirlean Kelly, MD      Allergies    Lisinopril, Pravastatin, Simvastatin, and Ciprofloxacin    Review of Systems   Review of Systems  Respiratory:  Positive for shortness of breath.   Cardiovascular:  Positive for chest pain.    Physical Exam Updated Vital Signs BP (!) 145/72   Pulse 71   Temp 98.2 F (36.8 C)   Resp 14   Ht 5\' 5"  (1.651 m)   Wt 73.6 kg   SpO2 98%   BMI 26.99 kg/m  Physical Exam Vitals and nursing note reviewed.  HENT:     Head: Atraumatic.  Cardiovascular:     Rate and Rhythm: Regular rhythm.  Pulmonary:     Breath sounds: No wheezing, rhonchi or rales.  Chest:     Chest wall: No tenderness.  Abdominal:     Tenderness: There is no abdominal tenderness.  Musculoskeletal:     Right lower leg: No edema.     Left lower leg: No edema.  Neurological:     Mental  Status: He is alert.     ED Results / Procedures / Treatments   Labs (all labs ordered are listed, but only abnormal results are displayed) Labs Reviewed  COMPREHENSIVE METABOLIC PANEL - Abnormal; Notable for the following components:      Result Value   Glucose, Bld 100 (*)    BUN 26 (*)    Creatinine, Ser 1.62 (*)    GFR, Estimated 42 (*)    All other components within normal limits  TROPONIN I (HIGH SENSITIVITY) - Abnormal; Notable for the following components:   Troponin I (High Sensitivity) 415 (*)    All other components within normal limits  CBC  HEPARIN LEVEL (UNFRACTIONATED)  CBC  TROPONIN I (HIGH SENSITIVITY)    EKG EKG Interpretation Date/Time:  Friday February 21 2023 14:19:23 EDT Ventricular Rate:  92 PR Interval:  153 QRS Duration:  115 QT Interval:  363 QTC Calculation: 449 R Axis:   -21  Text Interpretation: Sinus rhythm Ventricular premature complex Nonspecific intraventricular conduction delay Anterior infarct, old No significant change since last tracing Confirmed by Benjiman Core [id number]) on 02/21/2023 2:57:25 PM  Radiology DG Chest 2 View  Result Date: 02/21/2023 CLINICAL DATA:  Shortness of breath EXAM: CHEST - 2 VIEW COMPARISON:  X-ray 12/13/2013 FINDINGS: No pneumothorax or effusion. Slight opacity at the left lung base. New from previous. Acute process is possible. Underlying chronic lung changes. No pneumothorax or edema. Normal cardiopericardial silhouette. Calcified aorta. Overlapping cardiac leads. Degenerative changes of the spine. Fixation hardware in the lumbar spine region at the edge of the imaging field. IMPRESSION: Subtle opacity left lung base new from previous. An acute infiltrate is possible. Recommend follow-up. Underlying chronic changes as well. Electronically Signed   By: Karen Kays M.D.   On: 02/21/2023 15:05    Procedures Procedures    Medications Ordered in ED Medications  heparin ADULT infusion 100 units/mL (25000  units/252mL) (850 Units/hr Intravenous New Bag/Given 02/21/23 1615)  aspirin chewable tablet 324 mg (324 mg Oral Given 02/21/23 1534)  heparin bolus via infusion 4,000 Units (4,000 Units Intravenous Bolus from Bag 02/21/23 1616)    ED Course/ Medical Decision Making/ A&P                             Medical Decision Making Amount and/or Complexity of Data Reviewed Labs: ordered. Radiology: ordered.  Risk OTC drugs. Decision regarding hospitalization.   Patient with chest pain.  Anterior chest.  Is had for months but now developed pain at rest.  EKG reassuring and pain-free now, however troponin is elevated  400.  Discussed with cardiology.  Does not have a primary care cardiologist.  Cardiology requests admission to internal medicine at [facility] and starting on heparin.  No plan for heart cath tonight unless pain worsens or potentially if skyrocketing troponin.  X-ray did show potential pneumonia.  However no fever no cough and normal white count.  Will discuss with hospitalist for admission.  CRITICAL CARE Performed by: Benjiman Core Total critical care time: 30 minutes Critical care time was exclusive of separately billable procedures and treating other patients. Critical care was necessary to treat or prevent imminent or life-threatening deterioration. Critical care was time spent personally by me on the following activities: development of treatment plan with patient and/or surrogate as well as nursing, discussions with consultants, evaluation of patient's response to treatment, examination of patient, obtaining history from patient or surrogate, ordering and performing treatments and interventions, ordering and review of laboratory studies, ordering and review of radiographic studies, pulse oximetry and re-evaluation of patient's condition.         Final Clinical Impression(s) / ED Diagnoses Final diagnoses:  NSTEMI (non-ST elevated myocardial infarction) [facility])    Rx / DC  Orders ED Discharge Orders     None         Benjiman Core, MD 02/21/23 1539    Benjiman Core, MD 02/21/23 1704

## 2023-02-21 NOTE — Consult Note (Signed)
Cardiology Consultation   Patient ID: Calvin Marks MRN: [id number]; DOB: Jun 04, 1936  Admit date: 02/21/2023 Date of Consult: 02/21/2023  PCP:  Renford Dills, MD   [facility] Providers Cardiologist:  None     Ceasia Elwell New   Patient Profile:   Calvin Marks is a 87 y.o. male with a hx of HTN, BPH, GERD, depression who is being seen 02/21/2023 for the evaluation of angina/non-ST elevation myocardial infarction at the request of Dr Maryjean Ka.  History of Present Illness:   Calvin Marks has been in his usual state of health until about a month ago and noticed lower epigastric/chest pain with exertion when mowing the lawn.  He rests and pain resolves.  Associated symptoms of SOB, no nausea or diaphoresis.    This Tuesday and Wednesday evening/night he awoken with chest discomfort and left arm discomfort as well which after several minutes it subsided.  Finally on Thursday he took an aspirin.  When pushing himself he notices the pain.  He presented thinking he needs "roto rooter".     Hx Echo in 2018 with normal EF and LVH, G1DD done when he was having some palpitations.  Smoked last 50 years ago. No prior heart issues other than palpitations with workup revealing normal echocardiogram. Married. Nondiabetic. Father had a "hole in his heart "which exempted him from the [employer] and at a later year died of an MI.  Labs Na 138  K+ 3.8 BUn 26 Cr 1.62 LFTs WNL Hs troponin 415 second 300, trending downward WBC 7.3 Hgb 13.9 Plts 259  2V CXR   IMPRESSION: Subtle opacity left lung base new from previous. An acute infiltrate is possible. Recommend follow-up. Underlying chronic changes as well.  EKG:  The EKG was personally reviewed and demonstrates:  SR 60 old Q waves ant leads from 2015 new T wave inversion V6  Telemetry:  Telemetry was personally reviewed and demonstrates: Sinus rhythm.  Past Medical History:  Diagnosis Date   Arthritis    Constipation    Depression     Enlarged prostate    Environmental allergies    GERD (gastroesophageal reflux disease)    Hypertension     Past Surgical History:  Procedure Laterality Date   BACK SURGERY     COLONOSCOPY     DENTAL SURGERY       Home Medications:  Prior to Admission medications   Medication Sig Start Date End Date Taking? Authorizing Provider  amLODipine (NORVASC) 2.5 MG tablet Take 2.5 mg by mouth in the morning.   Yes [provider]  finasteride (PROSCAR) 5 MG tablet Take 5 mg by mouth every morning.   Yes [provider]  fish oil-omega-3 fatty acids 1000 MG capsule Take 1 g by mouth every morning.   Yes [provider]  HYDROcodone-acetaminophen (NORCO) 7.5-325 MG tablet Take 1 tablet by mouth 4 (four) times daily.   Yes [provider]  lansoprazole (PREVACID) 30 MG capsule Take 30 mg by mouth daily in the afternoon.   Yes [provider]  magnesium oxide (MAG-OX) 400 (240 Mg) MG tablet Take 400 mg by mouth at bedtime.   Yes [provider]  Melatonin 10 MG TABS Take 10 mg by mouth at bedtime.   Yes [provider]  meloxicam (MOBIC) 15 MG tablet Take 15 mg by mouth daily.   Yes [provider]  polyethylene glycol (MIRALAX / GLYCOLAX) packet Take 8.5 g by mouth in the morning.   Yes  [provider]  telmisartan-hydrochlorothiazide (MICARDIS HCT) 80-25 MG tablet Take 1 tablet by mouth in the morning.   Yes [provider]  traZODone (DESYREL) 100 MG tablet Take 100 mg by mouth at bedtime.   Yes [provider]  cyclobenzaprine (FLEXERIL) 10 MG tablet Take 0.5-1 tablets (5-10 mg total) by mouth 3 (three) times daily as needed for muscle spasms. Patient not taking: Reported on 02/21/2023 09/04/12   Shirlean Kelly, MD  cyclobenzaprine (FLEXERIL) 10 MG tablet Take 0.5-1 tablets (5-10 mg total) by mouth 3 (three) times daily as needed for muscle spasms. Patient not taking: Reported on 02/21/2023 12/24/13    Shirlean Kelly, MD  oxyCODONE-acetaminophen (PERCOCET/ROXICET) 5-325 MG per tablet Take 1-2 tablets by mouth every 4 (four) hours as needed for moderate pain. Patient not taking: Reported on 02/21/2023 12/24/13   Shirlean Kelly, MD    Inpatient Medications: Scheduled Meds:  [START ON 02/22/2023] aspirin  81 mg Oral Daily   [START ON 02/22/2023] finasteride  5 mg Oral q morning   HYDROcodone-acetaminophen  1 tablet Oral QID   magnesium oxide  400 mg Oral QHS   melatonin  10 mg Oral QHS   metoprolol tartrate  12.5 mg Oral BID   [START ON 02/22/2023] omega-3 acid ethyl esters  1 g Oral Daily   pantoprazole  20 mg Oral Daily   [START ON 02/22/2023] polyethylene glycol  8.5 g Oral Daily   rosuvastatin  10 mg Oral Daily   sodium chloride flush  3 mL Intravenous Q12H   traZODone  100 mg Oral QHS   Continuous Infusions:  sodium chloride 75 mL/hr at 02/21/23 1750   azithromycin     cefTRIAXone (ROCEPHIN)  IV 1 g (02/21/23 1900)   heparin 850 Units/hr (02/21/23 1615)   PRN Meds: acetaminophen **OR** acetaminophen, labetalol, polyethylene glycol  Allergies:    Allergies  Allergen Reactions   Lisinopril Other (See Comments)    Caused trouble urinating and soreness in the lower abdomen   Pravastatin Other (See Comments)    Leg cramps   Simvastatin Other (See Comments)    Welts, fever, headaches   Ciprofloxacin Hives and Rash    Social History:   Social History   Socioeconomic History   Marital status: Married    Spouse name: Not on file   Number of children: Not on file   Years of education: Not on file   Highest education level: Not on file  Occupational History   Not on file  Tobacco Use   Smoking status: Never   Smokeless tobacco: Not on file  Substance and Sexual Activity   Alcohol use: Yes    Comment: socially   Drug use: No   Sexual activity: Not on file  Other Topics Concern   Not on file  Social History Narrative   Not on file   Social Determinants of Health    Financial Resource Strain: Not on file  Food Insecurity: Not on file  Transportation Needs: Not on file  Physical Activity: Not on file  Stress: Not on file  Social Connections: Not on file  Intimate Partner Violence: Not on file    Family History:   As above  ROS:  Please see the history of present illness.  General:no colds or fevers, no weight changes Skin:no rashes or ulcers HEENT:no blurred vision, no congestion CV:see HPI PUL:see HPI GI:no diarrhea constipation or melena, no indigestion GU:no hematuria, still stands to urinate during the day, sits during night.  Has  an enlarged prostate. MS:no joint pain, no claudication Neuro:no syncope, no lightheadedness Endo:no diabetes, no thyroid disease' All other ROS reviewed and negative.     Physical Exam/Data:   Vitals:   02/21/23 1414 02/21/23 1419 02/21/23 1645 02/21/23 1855  BP:  121/74 (!) 145/72 126/68  Pulse:  84 71 74  Resp:  11 14   Temp:  98.2 F (36.8 C)  (!) 97.5 F (36.4 C)  TempSrc:    Oral  SpO2:  97% 98% 99%  Weight: 73.6 kg     Height: 5\' 5"  (1.651 m)      No intake or output data in the 24 hours ending 02/21/23 1928    02/21/2023    2:14 PM 12/13/2013    2:51 PM 08/26/2012    2:49 PM  Last 3 Weights  Weight (lbs) 162 lb 3.2 oz 164 lb 6.4 oz 162 lb  Weight (kg) 73.573 kg 74.571 kg 73.483 kg     Body mass index is 26.99 kg/m.  General:  Well nourished, well developed, in no acute distress HEENT: normal Neck: no JVD Vascular: No carotid bruits; Distal pulses 2+ bilaterally Cardiac:  normal S1, S2; RRR; no murmur  Lungs:  clear to auscultation bilaterally, no wheezing, rhonchi or rales  Abd: soft, nontender, no hepatomegaly  Ext: no edema Musculoskeletal:  No deformities, BUE and BLE strength normal and equal Skin: warm and dry  Neuro:  CNs 2-12 intact, no focal abnormalities noted Psych:  Normal affect     Relevant CV Studies: Echo 2018 --ordered for murmur  Study Conclusions   -  Left ventricle: The cavity size was normal. Wall thickness was    increased in a pattern of mild LVH. There was severe focal basal    hypertrophy of the septum. Systolic function was normal. The    estimated ejection fraction was in the range of 60% to 65%. Wall    motion was normal; there were no regional wall motion    abnormalities. Doppler parameters are consistent with abnormal    left ventricular relaxation (grade 1 diastolic dysfunction). The    E/e&' ratio is between 8-15, suggesting indeterminate LV filling    pressure.  - Aortic valve: Trileaflet. Sclerosis without stenosis. There was    no regurgitation.  - Mitral valve: Calcified annulus. Mildly thickened leaflets .    There was trivial regurgitation.  - Left atrium: The atrium was normal in size.  - Tricuspid valve: There was mild regurgitation.  - Pulmonary arteries: PA peak pressure: 23 mm Hg (S).  - Inferior vena cava: The vessel was normal in size. The    respirophasic diameter changes were in the normal range (>= 50%),    consistent with normal central venous pressure.   Impressions:   - LVEF 60-65%, mild LVH, normal wall motion, grade 1 DD,    indeterminate LV fililng prsesure, aortic valve sclerosis,    trivial MR, normal LA size, mild TR, RVSP 23 mmHg, normal IVC.   Laboratory Data:  High Sensitivity Troponin:   Recent Labs  Lab 02/21/23 1428 02/21/23 1641  TROPONINIHS 415* 306*     Chemistry Recent Labs  Lab 02/21/23 1428  NA 138  K 3.8  CL 105  CO2 23  GLUCOSE 100*  BUN 26*  CREATININE 1.62*  CALCIUM 9.5  GFRNONAA 42*  ANIONGAP 10    Recent Labs  Lab 02/21/23 1428  PROT 8.0  ALBUMIN 4.4  AST 22  ALT 12  ALKPHOS 79  BILITOT 0.6   Lipids No results for input(s): "CHOL", "TRIG", "HDL", "LABVLDL", "LDLCALC", "CHOLHDL" in the last 168 hours.  Hematology Recent Labs  Lab 02/21/23 1428  WBC 7.3  RBC 4.58  HGB 13.9  HCT 41.9  MCV 91.5  MCH 30.3  MCHC 33.2  RDW 13.6  PLT 259    Thyroid No results for input(s): "TSH", "FREET4" in the last 168 hours.  BNP Recent Labs  Lab 02/21/23 1428  BNP 205.8*    DDimer No results for input(s): "DDIMER" in the last 168 hours.   Radiology/Studies:  DG Chest 2 View  Result Date: 02/21/2023 CLINICAL DATA:  Shortness of breath EXAM: CHEST - 2 VIEW COMPARISON:  X-ray 12/13/2013 FINDINGS: No pneumothorax or effusion. Slight opacity at the left lung base. New from previous. Acute process is possible. Underlying chronic lung changes. No pneumothorax or edema. Normal cardiopericardial silhouette. Calcified aorta. Overlapping cardiac leads. Degenerative changes of the spine. Fixation hardware in the lumbar spine region at the edge of the imaging field. IMPRESSION: Subtle opacity left lung base new from previous. An acute infiltrate is possible. Recommend follow-up. Underlying chronic changes as well. Electronically Signed   By: Karen Kays M.D.   On: 02/21/2023 15:05     Assessment and Plan:   87 year old with no prior cardiac history other than palpitations here with accelerating anginal symptoms, recent rest anginal symptoms and original troponin of 400 trending downward to 300 compatible with non-ST elevation myocardial infarction likely taking place from stuttering chest discomfort on Tuesday and Wednesday.    Non-ST elevation myocardial infarction - Currently mentating well, blood pressure 120/68.  No evidence of shock.  BNP slightly elevated at 205.  It is likely that he has underlying coronary artery disease.  Plan will be to pursue cardiac catheterization on Monday barring urgent need for evaluation secondary to return of discomfort.  Risk and benefits have been explained including stroke heart attack death renal impairment bleeding.  He is willing to proceed. - Continue with aspirin - Continue with IV heparin - He has pravastatin and simvastatin listed under allergies.  I think we should try rosuvastatin.  Obviously if he has  problems with this in the future, alternatives may be necessary.  Given his advanced age, I will place him on 10 mg of rosuvastatin.  LDL goal less than 55.  Check lipid panel in the morning. -Echocardiogram pending.  Questionable AKI versus chronic kidney disease stage IIIa - Gently hydrating.  Continue to monitor.  Creatinine currently 1.62.  A few years back it was 1.0. - Currently on irbesartan 300.  I will go ahead and hold this in anticipation of angiogram and contrast load on Monday.  This can be added on post catheterization.  Subtle opacification left lower lobe with chronic lung changes - Currently getting ceftriaxone in case this is an infiltrate.  No evidence of fever or leukocytosis.  We will continue to monitor.  Risk Assessment/Risk Scores:     TIMI Risk Score for Unstable Angina or Non-ST Elevation MI:   The patient's TIMI risk score is 4, which indicates a 20% risk of all cause mortality, new or recurrent myocardial infarction or need for urgent revascularization in the next 14 days.          For questions or updates, please contact [facility] Please consult [email or web address] for contact info under    Signed, Donato Schultz, MD  02/21/2023 7:28 PM

## 2023-02-21 NOTE — Progress Notes (Signed)
ANTICOAGULATION CONSULT NOTE - Initial Consult  Pharmacy Consult for heparin Indication: chest pain/ACS  Allergies  Allergen Reactions   Ciprofloxacin Hives   Pravastatin     Patient Measurements: Height: 5\' 5"  (165.1 cm) Weight: 73.6 kg (162 lb 3.2 oz) IBW/kg (Calculated) : 61.5 Heparin Dosing Weight: 73.6 kg  Vital Signs: Temp: 98.2 F (36.8 C) (07/05 1419) BP: 121/74 (07/05 1419) Pulse Rate: 84 (07/05 1419)  Labs: Recent Labs    02/21/23 1428  HGB 13.9  HCT 41.9  PLT 259  CREATININE 1.62*  TROPONINIHS 415*    Estimated Creatinine Clearance: 29.5 mL/min (A) (by C-G formula based on SCr of 1.62 mg/dL (H)).   Medical History: Past Medical History:  Diagnosis Date   Arthritis    Constipation    Depression    Enlarged prostate    Environmental allergies    GERD (gastroesophageal reflux disease)    Hypertension      Assessment: 87 year old male presented with chest pain and shortness of breath. Chest pain associated with exertion, goes away with rest. Cardiology consulted - recommend starting on heparin. No anticoagulation noted PTA. Pharmacy consulted to manage heparin infusion.  Baseline CBC WNL.  Goal of Therapy:  Heparin level 0.3-0.7 units/ml Monitor platelets by anticoagulation protocol: Yes   Plan:  -Heparin 4000 unit bolus -Start heparin infusion at 850 units/hr -Check heparin level ~ 8 hours after start of infusion -Daily CBC while on heparin infusion  Pricilla Riffle, PharmD, BCPS Clinical Pharmacist 02/21/2023 3:50 PM

## 2023-02-22 ENCOUNTER — Inpatient Hospital Stay (HOSPITAL_COMMUNITY): Payer: Medicare Other

## 2023-02-22 DIAGNOSIS — M7989 Other specified soft tissue disorders: Secondary | ICD-10-CM | POA: Diagnosis not present

## 2023-02-22 DIAGNOSIS — I214 Non-ST elevation (NSTEMI) myocardial infarction: Secondary | ICD-10-CM

## 2023-02-22 LAB — BASIC METABOLIC PANEL
Anion gap: 5 (ref 5–15)
BUN: 23 mg/dL (ref 8–23)
CO2: 25 mmol/L (ref 22–32)
Calcium: 8.6 mg/dL — ABNORMAL LOW (ref 8.9–10.3)
Chloride: 104 mmol/L (ref 98–111)
Creatinine, Ser: 1.65 mg/dL — ABNORMAL HIGH (ref 0.61–1.24)
GFR, Estimated: 41 mL/min — ABNORMAL LOW (ref >60)
Glucose, Bld: 105 mg/dL — ABNORMAL HIGH (ref 70–99)
Potassium: 4.1 mmol/L (ref 3.5–5.1)
Sodium: 134 mmol/L — ABNORMAL LOW (ref 135–145)

## 2023-02-22 LAB — HEPARIN LEVEL (UNFRACTIONATED)
Heparin Unfractionated: 0.29 IU/mL — ABNORMAL LOW (ref 0.30–0.70)
Heparin Unfractionated: 0.27 IU/mL — ABNORMAL LOW (ref 0.30–0.70)

## 2023-02-22 LAB — ECHOCARDIOGRAM COMPLETE
AR max vel: 2.37 cm2
AV Area VTI: 2.36 cm2
AV Area mean vel: 2.29 cm2
AV Mean grad: 2 mmHg
AV Peak grad: 3.5 mmHg
Ao pk vel: 0.94 m/s
Area-P 1/2: 3.83 cm2
Height: 65 in
S' Lateral: 3.6 cm
Weight: 2444.8 oz

## 2023-02-22 LAB — LIPID PANEL
Cholesterol: 151 mg/dL (ref 0–200)
HDL: 32 mg/dL — ABNORMAL LOW (ref >40)
LDL Cholesterol: 101 mg/dL — ABNORMAL HIGH (ref 0–99)
Total CHOL/HDL Ratio: 4.7 RATIO
Triglycerides: 92 mg/dL (ref <150)
VLDL: 18 mg/dL (ref 0–40)

## 2023-02-22 LAB — MAGNESIUM: Magnesium: 2.2 mg/dL (ref 1.7–2.4)

## 2023-02-22 MED ORDER — AMOXICILLIN-POT CLAVULANATE 875-125 MG PO TABS
1.0000 | ORAL_TABLET | Freq: Two times a day (BID) | ORAL | Status: AC
  Administered 2023-02-22 – 2023-02-26 (×9): 1 via ORAL
  Filled 2023-02-22 (×11): qty 1

## 2023-02-22 NOTE — Progress Notes (Signed)
ANTICOAGULATION CONSULT NOTE - Follow up Consult  Pharmacy Consult for heparin Indication: chest pain/ACS  Allergies  Allergen Reactions   Lisinopril Other (See Comments)    Caused trouble urinating and soreness in the lower abdomen   Pravastatin Other (See Comments)    Leg cramps   Simvastatin Other (See Comments)    Welts, fever, headaches   Ciprofloxacin Hives and Rash    Patient Measurements: Height: 5\' 5"  (165.1 cm) Weight: 69.3 kg (152 lb 12.8 oz) IBW/kg (Calculated) : 61.5 Heparin Dosing Weight: 73.6 kg  Vital Signs: Temp: 97.7 F (36.5 C) (07/06 1006) Temp Source: Oral (07/06 1006) BP: 120/84 (07/06 1006) Pulse Rate: 70 (07/06 1006)  Labs: Recent Labs    02/21/23 1428 02/21/23 1641 02/22/23 0105 02/22/23 0950  HGB 13.9  --   --   --   HCT 41.9  --   --   --   PLT 259  --   --   --   HEPARINUNFRC  --   --  0.29* 0.27*  CREATININE 1.62*  --  1.65*  --   TROPONINIHS 415* 306*  --   --      Estimated Creatinine Clearance: 29 mL/min (A) (by C-G formula based on SCr of 1.65 mg/dL (H)).   Medical History: Past Medical History:  Diagnosis Date   Arthritis    Constipation    Depression    Enlarged prostate    Environmental allergies    GERD (gastroesophageal reflux disease)    Hypertension      Assessment: 87 year old male presented with chest pain and shortness of breath. Chest pain associated with exertion, goes away with rest. Cardiology consulted - recommend starting on heparin. No anticoagulation noted PTA. Pharmacy consulted to manage heparin infusion.  Baseline CBC WNL.  Goal of Therapy:  Heparin level 0.3-0.7 units/ml Monitor platelets by anticoagulation protocol: Yes   Plan:  Increase heparin infusion at 1050 units/hr -Daily CBC and heparin level Monitor s/s bleeding     Leota Sauers Pharm.D. CPP, BCPS Clinical Pharmacist [phone] 02/22/2023 1:44 PM

## 2023-02-22 NOTE — Progress Notes (Signed)
ANTICOAGULATION CONSULT NOTE - Follow Up Consult  Pharmacy Consult for heparin Indication:  NSTEMI  Labs: Recent Labs    02/21/23 1428 02/21/23 1641 02/22/23 0105  HGB 13.9  --   --   HCT 41.9  --   --   PLT 259  --   --   HEPARINUNFRC  --   --  0.29*  CREATININE 1.62*  --   --   TROPONINIHS 415* 306*  --     Assessment: 86yo male slightly subtherapeutic on heparin with initial dosing for NSTEMI; no infusion issues or signs of bleeding per RN.  Goal of Therapy:  Heparin level 0.3-0.7 units/ml   Plan:  Increase heparin infusion slightlyto 900 units/hr. Check level in 8 hours.   Vernard Gambles, PharmD, BCPS 02/22/2023 2:15 AM

## 2023-02-22 NOTE — Progress Notes (Signed)
VASCULAR LAB    Bilateral lower extremity venous duplex has been performed.  See CV proc for preliminary results.   Rakwon Letourneau, RVT 02/22/2023, 3:37 PM

## 2023-02-22 NOTE — Progress Notes (Signed)
Progress Note  Patient Name: Calvin Marks Date of Encounter: 02/22/2023  Primary Cardiologist: None   Subjective   Feels well. No chest or arm pain.  Inpatient Medications    Scheduled Meds:  amoxicillin-clavulanate  1 tablet Oral Q12H   aspirin  81 mg Oral Daily   finasteride  5 mg Oral q morning   HYDROcodone-acetaminophen  1 tablet Oral QID   magnesium oxide  400 mg Oral QHS   melatonin  10 mg Oral QHS   metoprolol tartrate  12.5 mg Oral BID   omega-3 acid ethyl esters  1 g Oral Daily   pantoprazole  20 mg Oral Daily   polyethylene glycol  8.5 g Oral Daily   rosuvastatin  10 mg Oral Daily   sodium chloride flush  3 mL Intravenous Q12H   traZODone  100 mg Oral QHS   Continuous Infusions:  heparin 900 Units/hr (02/22/23 0302)   PRN Meds: acetaminophen **OR** acetaminophen, labetalol, polyethylene glycol   Vital Signs    Vitals:   02/22/23 0014 02/22/23 0051 02/22/23 0446 02/22/23 1006  BP: (!) 81/48 98/64 (!) 116/58 120/84  Pulse: 61  67 70  Resp: 17  18 18   Temp: (!) 97.3 F (36.3 C)  (!) 97.5 F (36.4 C) 97.7 F (36.5 C)  TempSrc: Oral  Oral Oral  SpO2: 98%  98% 98%  Weight:   69.3 kg   Height:        Intake/Output Summary (Last 24 hours) at 02/22/2023 1128 Last data filed at 02/22/2023 1041 Gross per 24 hour  Intake 1651.39 ml  Output 500 ml  Net 1151.39 ml   Filed Weights   02/21/23 1414 02/22/23 0446  Weight: 73.6 kg 69.3 kg    Telemetry    Sinus rhythm PVC, lateral TWI - Personally Reviewed  ECG    02/21/2023 - Personally Reviewed  Physical Exam   GEN: No acute distress.   Neck: No JVD Cardiac: RRR, no murmurs, rubs, or gallops.  Respiratory: Clear to auscultation bilaterally. GI: Soft, nontender, non-distended  MS: No edema; No deformity. Neuro:  Nonfocal  Psych: Normal affect   Labs    Chemistry Recent Labs  Lab 02/21/23 1428 02/22/23 0105  NA 138 134*  K 3.8 4.1  CL 105 104  CO2 23 25  GLUCOSE 100* 105*  BUN 26*  23  CREATININE 1.62* 1.65*  CALCIUM 9.5 8.6*  PROT 8.0  --   ALBUMIN 4.4  --   AST 22  --   ALT 12  --   ALKPHOS 79  --   BILITOT 0.6  --   GFRNONAA 42* 41*  ANIONGAP 10 5     Hematology Recent Labs  Lab 02/21/23 1428  WBC 7.3  RBC 4.58  HGB 13.9  HCT 41.9  MCV 91.5  MCH 30.3  MCHC 33.2  RDW 13.6  PLT 259    Cardiac EnzymesNo results for input(s): "TROPONINI" in the last 168 hours. No results for input(s): "TROPIPOC" in the last 168 hours.   BNP Recent Labs  Lab 02/21/23 1428  BNP 205.8*     DDimer  Recent Labs  Lab 02/21/23 1849  DDIMER 0.46     Summary of Pertinent studies    TTE: pending  Cardiac cath: Plan for cath Monday  Imaging: CXR subtle opacity of the left lung base. Acute infiltrate not excluded.  Labs: reviewed  Patient Profile     87 y.o. male with history of hypertension who presented  to the emergency room with 3 days of intermittent left lower chest wall pain and shortness of breath. He has been having progressive chest pain with exertion.    Assessment & Plan    NSTEMI TnI peaked > 400, trending down Had a fairly typical syndrome Plan in place for cath 7/8 Continue aspirin Continue heparin IV  Continue rosuvastatin -- he seems to be tolerating it well TTE pending  Elevated creatinine Unknown baseline -- could be AKI vs CKD III Irbesartan held. BP ok (hypotensive over night)  Focal pulmonary infiltrate Per medicine Getting augmentin   For questions or updates, please contact [facility] Please consult [email or web address] for contact info under Cardiology/STEMI.      Signed, Maurice Small, MD 02/22/2023, 11:28 AM

## 2023-02-22 NOTE — Progress Notes (Signed)
PROGRESS NOTE    Calvin Marks  ZOX:[id number] DOB: Jan 22, 1936 DOA: 02/21/2023 PCP: Renford Dills, MD    Brief Narrative:  87 year old with history of hypertension presented to the emergency room with last 3 days of intermittent left lower chest wall pain and likely shortness of breath.  He does have history of hypertension, BPH and GERD as well as depression.  Lives at home.  He is complaining of some symptoms since last 1 month.  In the emergency room hemodynamically stable.  Troponins 415-300 and trending down.  Chest x-ray with subtle opacity left lung base new from previous findings.  Admitted with IV antibiotics, cardiology consulted due to elevated troponins and started on heparin.   Assessment & Plan:   Non-ST elevation MI: Currently hemodynamically stable.  Chest pain-free.  Will need invasive cardiac ischemia evaluation.  Remains on aspirin, IV heparin, statin.  Echocardiogram today.  Likely cardiac cath on 7/8.  Cardiology following.  Suspected pneumonia left lower lobe: Without evidence of fever or leukocytosis.  Started on Rocephin and azithromycin.   Since patient is fairly stable, will treat with Augmentin for 5 days.  Mobilize.  Incentive spirometry.  Hyperlipidemia: LDL 101.  Allergic to simvastatin.  Started on Crestor.  Will monitor.  CKD stage IIIb: This is likely his baseline creatinine.  Will discontinue IV fluids.  Echocardiogram pending.  Renal ultrasound pending.  DVT prophylaxis: SCDs Start: 02/21/23 1739   Code Status: Full code Family Communication: None at the bedside Disposition Plan: Status is: Inpatient Remains inpatient appropriate because: Heparin infusion, inpatient procedures planned     Consultants:  Cardiology  Procedures:  None  Antimicrobials:  Rocephin and azithromycin 7/5---   Subjective: Patient seen in the morning rounds.  Denies any complaints other than frequent awakening and multiple blood draws.  Will try to simplify his  regimen.  Will discontinue IV antibiotics and start him on oral antibiotics.  Will discuss with cardiology if he can be managed with Lovenox instead of heparin so he can get some rest.  He had question about cardiac catheterization and stenting that I answered him to best of my knowledge.  Objective: Vitals:   02/22/23 0014 02/22/23 0051 02/22/23 0446 02/22/23 1006  BP: (!) 81/48 98/64 (!) 116/58 120/84  Pulse: 61  67 70  Resp: 17  18 18   Temp: (!) 97.3 F (36.3 C)  (!) 97.5 F (36.4 C) 97.7 F (36.5 C)  TempSrc: Oral  Oral Oral  SpO2: 98%  98% 98%  Weight:   69.3 kg   Height:        Intake/Output Summary (Last 24 hours) at 02/22/2023 1052 Last data filed at 02/22/2023 1041 Gross per 24 hour  Intake 1651.39 ml  Output 500 ml  Net 1151.39 ml   Filed Weights   02/21/23 1414 02/22/23 0446  Weight: 73.6 kg 69.3 kg    Examination:  General exam: Appears calm and comfortable  Respiratory system: Clear to auscultation. Respiratory effort normal. Cardiovascular system: S1 & S2 heard, RRR.  Gastrointestinal system: Soft.  Nontender. Central nervous system: Alert and oriented. No focal neurological deficits. Extremities: Symmetric 5 x 5 power.    Data Reviewed: I have personally reviewed following labs and imaging studies  CBC: Recent Labs  Lab 02/21/23 1428  WBC 7.3  HGB 13.9  HCT 41.9  MCV 91.5  PLT 259   Basic Metabolic Panel: Recent Labs  Lab 02/21/23 1428 02/22/23 0105  NA 138 134*  K 3.8 4.1  CL 105  104  CO2 23 25  GLUCOSE 100* 105*  BUN 26* 23  CREATININE 1.62* 1.65*  CALCIUM 9.5 8.6*  MG  --  2.2   GFR: Estimated Creatinine Clearance: 29 mL/min (A) (by C-G formula based on SCr of 1.65 mg/dL (H)). Liver Function Tests: Recent Labs  Lab 02/21/23 1428  AST 22  ALT 12  ALKPHOS 79  BILITOT 0.6  PROT 8.0  ALBUMIN 4.4   No results for input(s): "LIPASE", "AMYLASE" in the last 168 hours. No results for input(s): "AMMONIA" in the last 168  hours. Coagulation Profile: No results for input(s): "INR", "PROTIME" in the last 168 hours. Cardiac Enzymes: No results for input(s): "CKTOTAL", "CKMB", "CKMBINDEX", "TROPONINI" in the last 168 hours. BNP (last 3 results) No results for input(s): "PROBNP" in the last 8760 hours. HbA1C: No results for input(s): "HGBA1C" in the last 72 hours. CBG: No results for input(s): "GLUCAP" in the last 168 hours. Lipid Profile: Recent Labs    02/22/23 0105  CHOL 151  HDL 32*  LDLCALC 101*  TRIG 92  CHOLHDL 4.7   Thyroid Function Tests: No results for input(s): "TSH", "T4TOTAL", "FREET4", "T3FREE", "THYROIDAB" in the last 72 hours. Anemia Panel: No results for input(s): "VITAMINB12", "FOLATE", "FERRITIN", "TIBC", "IRON", "RETICCTPCT" in the last 72 hours. Sepsis Labs: No results for input(s): "PROCALCITON", "LATICACIDVEN" in the last 168 hours.  Recent Results (from the past 240 hour(s))  SARS Coronavirus 2 by RT PCR (hospital order, performed in [facility] hospital lab) *cepheid single result test* Nasopharyngeal Swab     Status: None   Collection Time: 02/21/23  6:19 PM   Specimen: Nasopharyngeal Swab; Nasal Swab  Result Value Ref Range Status   SARS Coronavirus 2 by RT PCR NEGATIVE NEGATIVE Final    Comment: Performed at [facility], [address] [place], [place] [address]  Respiratory (~20 pathogens) panel by PCR     Status: None   Collection Time: 02/21/23  6:19 PM   Specimen: Nasopharyngeal Swab; Respiratory  Result Value Ref Range Status   Adenovirus NOT DETECTED NOT DETECTED Final   Coronavirus 229E NOT DETECTED NOT DETECTED Final    Comment: (NOTE) The Coronavirus on the Respiratory Panel, DOES NOT test for the novel  Coronavirus (2019 nCoV)    Coronavirus HKU1 NOT DETECTED NOT DETECTED Final   Coronavirus NL63 NOT DETECTED NOT DETECTED Final   Coronavirus OC43 NOT DETECTED NOT DETECTED Final   Metapneumovirus NOT DETECTED NOT DETECTED Final   Rhinovirus /  Enterovirus NOT DETECTED NOT DETECTED Final   Influenza A NOT DETECTED NOT DETECTED Final   Influenza B NOT DETECTED NOT DETECTED Final   Parainfluenza Virus 1 NOT DETECTED NOT DETECTED Final   Parainfluenza Virus 2 NOT DETECTED NOT DETECTED Final   Parainfluenza Virus 3 NOT DETECTED NOT DETECTED Final   Parainfluenza Virus 4 NOT DETECTED NOT DETECTED Final   Respiratory Syncytial Virus NOT DETECTED NOT DETECTED Final   Bordetella pertussis NOT DETECTED NOT DETECTED Final   Bordetella Parapertussis NOT DETECTED NOT DETECTED Final   Chlamydophila pneumoniae NOT DETECTED NOT DETECTED Final   Mycoplasma pneumoniae NOT DETECTED NOT DETECTED Final    Comment: Performed at [facility], [address], [place], [place] [address]         Radiology Studies: US RENAL  Result Date: 02/21/2023 CLINICAL DATA:  Renal failure. EXAM: RENAL / URINARY TRACT ULTRASOUND COMPLETE COMPARISON:  None Available. FINDINGS: Right Kidney: Renal measurements: 10.3 x 4.6 x 3.8 cm =  volume: 93 mL. Thinning of the renal parenchyma with increased echogenicity. No hydronephrosis. Simple cyst in the mid kidney measures 2.8 cm. No evidence of solid lesion or stone. Left Kidney: Renal measurements: 6.7 x 3.2 x 3 cm = volume: 33 mL. Parenchymal atrophy with thinning of the cortex and increased echogenicity. No hydronephrosis. Limited assessment, although no evidence of focal lesion or stone. Bladder: Limited assessment.  Both ureteral jets are demonstrated. Other: None. IMPRESSION: 1. No obstructive uropathy. 2. Left renal atrophy. Thinning of both renal parenchyma (left-greater-than-right) with increased echogenicity typical of chronic medical renal disease. 3. Right renal cyst needs no further imaging follow-up. Electronically Signed   By: Narda Rutherford M.D.   On: 02/21/2023 19:37   DG Chest 2 View  Result Date: 02/21/2023 CLINICAL DATA:  Shortness of breath EXAM: CHEST - 2 VIEW COMPARISON:  X-ray 12/13/2013 FINDINGS:  No pneumothorax or effusion. Slight opacity at the left lung base. New from previous. Acute process is possible. Underlying chronic lung changes. No pneumothorax or edema. Normal cardiopericardial silhouette. Calcified aorta. Overlapping cardiac leads. Degenerative changes of the spine. Fixation hardware in the lumbar spine region at the edge of the imaging field. IMPRESSION: Subtle opacity left lung base new from previous. An acute infiltrate is possible. Recommend follow-up. Underlying chronic changes as well. Electronically Signed   By: Karen Kays M.D.   On: 02/21/2023 15:05        Scheduled Meds:  amoxicillin-clavulanate  1 tablet Oral Q12H   aspirin  81 mg Oral Daily   finasteride  5 mg Oral q morning   HYDROcodone-acetaminophen  1 tablet Oral QID   magnesium oxide  400 mg Oral QHS   melatonin  10 mg Oral QHS   metoprolol tartrate  12.5 mg Oral BID   omega-3 acid ethyl esters  1 g Oral Daily   pantoprazole  20 mg Oral Daily   polyethylene glycol  8.5 g Oral Daily   rosuvastatin  10 mg Oral Daily   sodium chloride flush  3 mL Intravenous Q12H   traZODone  100 mg Oral QHS   Continuous Infusions:  heparin 900 Units/hr (02/22/23 0302)     LOS: 1 day    Time spent: 35 minutes    Dorcas Carrow, MD [facility]

## 2023-02-23 DIAGNOSIS — I214 Non-ST elevation (NSTEMI) myocardial infarction: Secondary | ICD-10-CM | POA: Diagnosis not present

## 2023-02-23 LAB — BASIC METABOLIC PANEL
Anion gap: 9 (ref 5–15)
BUN: 17 mg/dL (ref 8–23)
CO2: 24 mmol/L (ref 22–32)
Calcium: 9.1 mg/dL (ref 8.9–10.3)
Chloride: 103 mmol/L (ref 98–111)
Creatinine, Ser: 1.41 mg/dL — ABNORMAL HIGH (ref 0.61–1.24)
GFR, Estimated: 49 mL/min — ABNORMAL LOW (ref >60)
Glucose, Bld: 132 mg/dL — ABNORMAL HIGH (ref 70–99)
Potassium: 4.1 mmol/L (ref 3.5–5.1)
Sodium: 136 mmol/L (ref 135–145)

## 2023-02-23 LAB — CBC
HCT: 34.7 % — ABNORMAL LOW (ref 39.0–52.0)
Hemoglobin: 11.5 g/dL — ABNORMAL LOW (ref 13.0–17.0)
MCH: 30.5 pg (ref 26.0–34.0)
MCHC: 33.1 g/dL (ref 30.0–36.0)
MCV: 92 fL (ref 80.0–100.0)
Platelets: 202 10*3/uL (ref 150–400)
RBC: 3.77 MIL/uL — ABNORMAL LOW (ref 4.22–5.81)
RDW: 13.5 % (ref 11.5–15.5)
WBC: 5.6 10*3/uL (ref 4.0–10.5)
nRBC: 0 % (ref 0.0–0.2)

## 2023-02-23 LAB — HEPARIN LEVEL (UNFRACTIONATED): Heparin Unfractionated: 0.41 IU/mL (ref 0.30–0.70)

## 2023-02-23 NOTE — Progress Notes (Signed)
PROGRESS NOTE    Calvin Marks  ZOX:[id number] DOB: 1936-02-22 DOA: 02/21/2023 PCP: Renford Dills, MD    Brief Narrative:  87 year old with history of hypertension presented to the emergency room with last 3 days of intermittent left lower chest wall pain and likely shortness of breath.  He does have history of hypertension, BPH and GERD as well as depression.  Lives at home.  He is complaining of some symptoms since last 1 month.  In the emergency room hemodynamically stable.  Troponins 415-300 and trending down.  Chest x-ray with subtle opacity left lung base new from previous findings.  Admitted with IV antibiotics, cardiology consulted due to elevated troponins and started on heparin.   Assessment & Plan:   Non-ST elevation MI: Currently hemodynamically stable.  Chest pain-free.   Remains on aspirin, IV heparin, statin.  Echocardiogram without any wall motion abnormalities.  Scheduled for cardiac cath tomorrow.  Cardiology following.  Suspected pneumonia left lower lobe: Less likely.  Without evidence of fever or leukocytosis.  Will complete Augmentin for 5 days.  Mobilize.  Incentive spirometry.  Hyperlipidemia: LDL 101.  Allergic to simvastatin.  Started on Crestor.  Will monitor.  Tolerating.  CKD stage IIIb: This is likely his baseline creatinine.  Will discontinue IV fluids. Kidney ultrasound with evidence of medical renal disease likely chronic.  DVT prophylaxis: SCDs Start: 02/21/23 1739   Code Status: Full code Family Communication: None at the bedside Disposition Plan: Status is: Inpatient Remains inpatient appropriate because: Heparin infusion, inpatient procedures planned     Consultants:  Cardiology  Procedures:  None  Antimicrobials:  Rocephin and azithromycin 7/5--- 76 Augmentin 7/6---   Subjective:  Patient seen and examined.  Frequent intervention and sleepless night. Denies any chest pain or palpitations.  Denies any cough congestion or  wheezing.  Objective: Vitals:   02/23/23 0510 02/23/23 0515 02/23/23 0516 02/23/23 0754  BP: 129/68   114/60  Pulse: 66 (!) 59  70  Resp:    16  Temp:   98 F (36.7 C) 97.8 F (36.6 C)  TempSrc:   Oral Oral  SpO2: 99% 97%  97%  Weight:      Height:        Intake/Output Summary (Last 24 hours) at 02/23/2023 1104 Last data filed at 02/23/2023 0821 Gross per 24 hour  Intake 993.44 ml  Output 575 ml  Net 418.44 ml    Filed Weights   02/21/23 1414 02/22/23 0446 02/23/23 0500  Weight: 73.6 kg 69.3 kg 69.6 kg    Examination:  General exam: Appears calm and comfortable  Respiratory system: No added sounds.  Cardiovascular system: S1 & S2 heard, RRR.  Gastrointestinal system: Soft.  Nontender. Central nervous system: Alert and oriented. No focal neurological deficits. Extremities: Symmetric 5 x 5 power.    Data Reviewed: I have personally reviewed following labs and imaging studies  CBC: Recent Labs  Lab 02/21/23 1428 02/23/23 0034  WBC 7.3 5.6  HGB 13.9 11.5*  HCT 41.9 34.7*  MCV 91.5 92.0  PLT 259 202    Basic Metabolic Panel: Recent Labs  Lab 02/21/23 1428 02/22/23 0105  NA 138 134*  K 3.8 4.1  CL 105 104  CO2 23 25  GLUCOSE 100* 105*  BUN 26* 23  CREATININE 1.62* 1.65*  CALCIUM 9.5 8.6*  MG  --  2.2    GFR: Estimated Creatinine Clearance: 29 mL/min (A) (by C-G formula based on SCr of 1.65 mg/dL (H)). Liver Function Tests: Recent  Labs  Lab 02/21/23 1428  AST 22  ALT 12  ALKPHOS 79  BILITOT 0.6  PROT 8.0  ALBUMIN 4.4    No results for input(s): "LIPASE", "AMYLASE" in the last 168 hours. No results for input(s): "AMMONIA" in the last 168 hours. Coagulation Profile: No results for input(s): "INR", "PROTIME" in the last 168 hours. Cardiac Enzymes: No results for input(s): "CKTOTAL", "CKMB", "CKMBINDEX", "TROPONINI" in the last 168 hours. BNP (last 3 results) No results for input(s): "PROBNP" in the last 8760 hours. HbA1C: No results for  input(s): "HGBA1C" in the last 72 hours. CBG: No results for input(s): "GLUCAP" in the last 168 hours. Lipid Profile: Recent Labs    02/22/23 0105  CHOL 151  HDL 32*  LDLCALC 101*  TRIG 92  CHOLHDL 4.7    Thyroid Function Tests: No results for input(s): "TSH", "T4TOTAL", "FREET4", "T3FREE", "THYROIDAB" in the last 72 hours. Anemia Panel: No results for input(s): "VITAMINB12", "FOLATE", "FERRITIN", "TIBC", "IRON", "RETICCTPCT" in the last 72 hours. Sepsis Labs: No results for input(s): "PROCALCITON", "LATICACIDVEN" in the last 168 hours.  Recent Results (from the past 240 hour(s))  SARS Coronavirus 2 by RT PCR (hospital order, performed in [facility] hospital lab) *cepheid single result test* Nasopharyngeal Swab     Status: None   Collection Time: 02/21/23  6:19 PM   Specimen: Nasopharyngeal Swab; Nasal Swab  Result Value Ref Range Status   SARS Coronavirus 2 by RT PCR NEGATIVE NEGATIVE Final    Comment: Performed at [facility], [address], [place], [place] [address]  Respiratory (~20 pathogens) panel by PCR     Status: None   Collection Time: 02/21/23  6:19 PM   Specimen: Nasopharyngeal Swab; Respiratory  Result Value Ref Range Status   Adenovirus NOT DETECTED NOT DETECTED Final   Coronavirus 229E NOT DETECTED NOT DETECTED Final    Comment: (NOTE) The Coronavirus on the Respiratory Panel, DOES NOT test for the novel  Coronavirus (2019 nCoV)    Coronavirus HKU1 NOT DETECTED NOT DETECTED Final   Coronavirus NL63 NOT DETECTED NOT DETECTED Final   Coronavirus OC43 NOT DETECTED NOT DETECTED Final   Metapneumovirus NOT DETECTED NOT DETECTED Final   Rhinovirus / Enterovirus NOT DETECTED NOT DETECTED Final   Influenza A NOT DETECTED NOT DETECTED Final   Influenza B NOT DETECTED NOT DETECTED Final   Parainfluenza Virus 1 NOT DETECTED NOT DETECTED Final   Parainfluenza Virus 2 NOT DETECTED NOT DETECTED Final   Parainfluenza Virus 3 NOT DETECTED NOT DETECTED Final    Parainfluenza Virus 4 NOT DETECTED NOT DETECTED Final   Respiratory Syncytial Virus NOT DETECTED NOT DETECTED Final   Bordetella pertussis NOT DETECTED NOT DETECTED Final   Bordetella Parapertussis NOT DETECTED NOT DETECTED Final   Chlamydophila pneumoniae NOT DETECTED NOT DETECTED Final   Mycoplasma pneumoniae NOT DETECTED NOT DETECTED Final    Comment: Performed at [facility], [address] [place], [place] [address]         Radiology Studies: VAS [place] LOWER EXTREMITY VENOUS (DVT)  Result Date: 02/22/2023  Lower Venous DVT Study Patient Name:  Calvin Marks  Date of Exam:   02/22/2023 Medical Rec #: [id number]          Accession #:    [id number] Date of Birth: [birth year]          Patient Gender: M Patient Age:   33 years Exam Location:  [facility] Procedure:  VAS [place] LOWER EXTREMITY VENOUS (DVT) Referring Phys: [facility] GOEL --------------------------------------------------------------------------------  Indications: Swelling.  Comparison Study: No prior study on file Performing Technologist: Sherren Kerns RVS  Examination Guidelines: A complete evaluation includes B-mode imaging, spectral Doppler, color Doppler, and power Doppler as needed of all accessible portions of each vessel. Bilateral testing is considered an integral part of a complete examination. Limited examinations for reoccurring indications may be performed as noted. The reflux portion of the exam is performed with the patient in reverse Trendelenburg.  +---------+---------------+---------+-----------+----------+--------------+ RIGHT    CompressibilityPhasicitySpontaneityPropertiesThrombus Aging +---------+---------------+---------+-----------+----------+--------------+ CFV      Full           Yes      Yes                                 +---------+---------------+---------+-----------+----------+--------------+ SFJ      Full                                                         +---------+---------------+---------+-----------+----------+--------------+ FV Prox  Full                                                        +---------+---------------+---------+-----------+----------+--------------+ FV Mid   Full                                                        +---------+---------------+---------+-----------+----------+--------------+ FV DistalFull                                                        +---------+---------------+---------+-----------+----------+--------------+ PFV      Full                                                        +---------+---------------+---------+-----------+----------+--------------+ POP      Full           Yes      Yes                                 +---------+---------------+---------+-----------+----------+--------------+ PTV      Full                                                        +---------+---------------+---------+-----------+----------+--------------+ PERO     Full                                                        +---------+---------------+---------+-----------+----------+--------------+   +---------+---------------+---------+-----------+----------+--------------+  LEFT     CompressibilityPhasicitySpontaneityPropertiesThrombus Aging +---------+---------------+---------+-----------+----------+--------------+ CFV      Full           Yes      Yes                                 +---------+---------------+---------+-----------+----------+--------------+ SFJ      Full                                                        +---------+---------------+---------+-----------+----------+--------------+ FV Prox  Full                                                        +---------+---------------+---------+-----------+----------+--------------+ FV Mid   Full                                                         +---------+---------------+---------+-----------+----------+--------------+ FV DistalFull                                                        +---------+---------------+---------+-----------+----------+--------------+ PFV      Full                                                        +---------+---------------+---------+-----------+----------+--------------+ POP      Full           Yes      Yes                                 +---------+---------------+---------+-----------+----------+--------------+ PTV      Full                                                        +---------+---------------+---------+-----------+----------+--------------+ PERO     Full                                                        +---------+---------------+---------+-----------+----------+--------------+     Summary: BILATERAL: - No evidence of deep vein thrombosis seen in the lower extremities, bilaterally. -No evidence of popliteal cyst, bilaterally.   *See table(s) above for measurements and observations. Electronically signed by Lemar Livings MD on 02/22/2023 at 3:52:27 PM.  Final    ECHOCARDIOGRAM COMPLETE  Result Date: 02/22/2023    ECHOCARDIOGRAM REPORT   Patient Name:   Calvin Marks Date of Exam: 02/22/2023 Medical Rec #:  [id number]         Height:       65.0 in Accession #:    [id number]        Weight:       152.8 lb Date of Birth:  [birth year]         BSA:          1.764 m Patient Age:    86 years          BP:           116/58 mmHg Patient Gender: M                 HR:           74 bpm. Exam Location:  Inpatient Procedure: 2D Echo, Cardiac Doppler and Color Doppler Indications:    NSTEMI  History:        Patient has prior history of Echocardiogram examinations, most                 recent 07/09/2017. Acute MI and NSTEMI; CKD.  Sonographer:    Milbert Coulter Referring Phys: [id number] [facility] GOEL IMPRESSIONS  1. Akinesis of the inferolateral wall and basal inferior wall; overall moderate  LV dysfunction.  2. Left ventricular ejection fraction, by estimation, is 35 to 40%. The left ventricle has moderately decreased function. The left ventricle demonstrates regional wall motion abnormalities (see scoring diagram/findings for description). Left ventricular  diastolic parameters are consistent with Grade I diastolic dysfunction (impaired relaxation).  3. Right ventricular systolic function is normal. The right ventricular size is normal.  4. The mitral valve is normal in structure. Moderate mitral valve regurgitation. No evidence of mitral stenosis.  5. The aortic valve is tricuspid. Aortic valve regurgitation is trivial. Aortic valve sclerosis is present, with no evidence of aortic valve stenosis.  6. The inferior vena cava is normal in size with greater than 50% respiratory variability, suggesting right atrial pressure of 3 mmHg. FINDINGS  Left Ventricle: Left ventricular ejection fraction, by estimation, is 35 to 40%. The left ventricle has moderately decreased function. The left ventricle demonstrates regional wall motion abnormalities. The left ventricular internal cavity size was normal in size. There is no left ventricular hypertrophy. Left ventricular diastolic parameters are consistent with Grade I diastolic dysfunction (impaired relaxation). Right Ventricle: The right ventricular size is normal. Right ventricular systolic function is normal. Left Atrium: Left atrial size was normal in size. Right Atrium: Right atrial size was normal in size. Pericardium: There is no evidence of pericardial effusion. Mitral Valve: The mitral valve is normal in structure. Mild mitral annular calcification. Moderate mitral valve regurgitation. No evidence of mitral valve stenosis. Tricuspid Valve: The tricuspid valve is normal in structure. Tricuspid valve regurgitation is trivial. No evidence of tricuspid stenosis. Aortic Valve: The aortic valve is tricuspid. Aortic valve regurgitation is trivial. Aortic valve  sclerosis is present, with no evidence of aortic valve stenosis. Aortic valve mean gradient measures 2.0 mmHg. Aortic valve peak gradient measures 3.5 mmHg. Aortic valve area, by VTI measures 2.36 cm. Pulmonic Valve: The pulmonic valve was normal in structure. Pulmonic valve regurgitation is trivial. No evidence of pulmonic stenosis. Aorta: The aortic root is normal in size and structure. Venous: The inferior vena cava is normal in size with greater than 50% respiratory variability,  suggesting right atrial pressure of 3 mmHg. IAS/Shunts: No atrial level shunt detected by color flow Doppler. Additional Comments: Akinesis of the inferolateral wall and basal inferior wall; overall moderate LV dysfunction.  LEFT VENTRICLE PLAX 2D LVIDd:         4.20 cm   Diastology LVIDs:         3.60 cm   LV e' medial:    6.96 cm/s LV PW:         0.90 cm   LV E/e' medial:  9.9 LV IVS:        0.90 cm   LV e' lateral:   8.49 cm/s LVOT diam:     2.10 cm   LV E/e' lateral: 8.1 LV SV:         46 LV SV Index:   26 LVOT Area:     3.46 cm  RIGHT VENTRICLE RV S prime:     19.00 cm/s TAPSE (M-mode): 2.0 cm LEFT ATRIUM             Index        RIGHT ATRIUM           Index LA diam:        3.70 cm 2.10 cm/m   RA Area:     16.10 cm LA Vol (A2C):   46.0 ml 26.07 ml/m  RA Volume:   37.30 ml  21.14 ml/m LA Vol (A4C):   57.4 ml 32.53 ml/m LA Biplane Vol: 52.1 ml 29.53 ml/m  AORTIC VALVE AV Area (Vmax):    2.37 cm AV Area (Vmean):   2.29 cm AV Area (VTI):     2.36 cm AV Vmax:           94.10 cm/s AV Vmean:          62.700 cm/s AV VTI:            0.195 m AV Peak Grad:      3.5 mmHg AV Mean Grad:      2.0 mmHg LVOT Vmax:         64.50 cm/s LVOT Vmean:        41.400 cm/s LVOT VTI:          0.133 m LVOT/AV VTI ratio: 0.68  AORTA Ao Root diam: 3.30 cm MITRAL VALVE MV Area (PHT): 3.83 cm    SHUNTS MV Decel Time: 198 msec    Systemic VTI:  0.13 m MV E velocity: 68.70 cm/s  Systemic Diam: 2.10 cm MV A velocity: 75.50 cm/s MV E/A ratio:  0.91 Olga Millers MD Electronically signed by Olga Millers MD Signature Date/Time: 02/22/2023/3:46:19 PM    Final    US RENAL  Result Date: 02/21/2023 CLINICAL DATA:  Renal failure. EXAM: RENAL / URINARY TRACT ULTRASOUND COMPLETE COMPARISON:  None Available. FINDINGS: Right Kidney: Renal measurements: 10.3 x 4.6 x 3.8 cm = volume: 93 mL. Thinning of the renal parenchyma with increased echogenicity. No hydronephrosis. Simple cyst in the mid kidney measures 2.8 cm. No evidence of solid lesion or stone. Left Kidney: Renal measurements: 6.7 x 3.2 x 3 cm = volume: 33 mL. Parenchymal atrophy with thinning of the cortex and increased echogenicity. No hydronephrosis. Limited assessment, although no evidence of focal lesion or stone. Bladder: Limited assessment.  Both ureteral jets are demonstrated. Other: None. IMPRESSION: 1. No obstructive uropathy. 2. Left renal atrophy. Thinning of both renal parenchyma (left-greater-than-right) with increased echogenicity typical of chronic medical renal disease. 3. Right renal cyst needs no  further imaging follow-up. Electronically Signed   By: Narda Rutherford M.D.   On: 02/21/2023 19:37   DG Chest 2 View  Result Date: 02/21/2023 CLINICAL DATA:  Shortness of breath EXAM: CHEST - 2 VIEW COMPARISON:  X-ray 12/13/2013 FINDINGS: No pneumothorax or effusion. Slight opacity at the left lung base. New from previous. Acute process is possible. Underlying chronic lung changes. No pneumothorax or edema. Normal cardiopericardial silhouette. Calcified aorta. Overlapping cardiac leads. Degenerative changes of the spine. Fixation hardware in the lumbar spine region at the edge of the imaging field. IMPRESSION: Subtle opacity left lung base new from previous. An acute infiltrate is possible. Recommend follow-up. Underlying chronic changes as well. Electronically Signed   By: Karen Kays M.D.   On: 02/21/2023 15:05        Scheduled Meds:  amoxicillin-clavulanate  1 tablet Oral Q12H   aspirin   81 mg Oral Daily   finasteride  5 mg Oral q morning   HYDROcodone-acetaminophen  1 tablet Oral QID   magnesium oxide  400 mg Oral QHS   melatonin  10 mg Oral QHS   metoprolol tartrate  12.5 mg Oral BID   omega-3 acid ethyl esters  1 g Oral Daily   pantoprazole  20 mg Oral Daily   polyethylene glycol  8.5 g Oral Daily   rosuvastatin  10 mg Oral Daily   sodium chloride flush  3 mL Intravenous Q12H   traZODone  100 mg Oral QHS   Continuous Infusions:  heparin 1,050 Units/hr (02/22/23 1617)     LOS: 2 days    Time spent: 35 minutes    Dorcas Carrow, MD [facility]

## 2023-02-23 NOTE — Progress Notes (Signed)
ANTICOAGULATION CONSULT NOTE - Follow up Consult  Pharmacy Consult for heparin Indication: chest pain/ACS  Allergies  Allergen Reactions   Lisinopril Other (See Comments)    Caused trouble urinating and soreness in the lower abdomen   Pravastatin Other (See Comments)    Leg cramps   Simvastatin Other (See Comments)    Welts, fever, headaches   Ciprofloxacin Hives and Rash    Patient Measurements: Height: 5\' 5"  (165.1 cm) Weight: 69.6 kg (153 lb 6.4 oz) IBW/kg (Calculated) : 61.5 Heparin Dosing Weight: 73.6 kg  Vital Signs: Temp: 97.6 F (36.4 C) (07/07 1116) Temp Source: Oral (07/07 1116) BP: 122/61 (07/07 1116) Pulse Rate: 58 (07/07 1116)  Labs: Recent Labs    02/21/23 1428 02/21/23 1641 02/22/23 0105 02/22/23 0950 02/23/23 0034 02/23/23 1006  HGB 13.9  --   --   --  11.5*  --   HCT 41.9  --   --   --  34.7*  --   PLT 259  --   --   --  202  --   HEPARINUNFRC  --   --  0.29* 0.27*  --  0.41  CREATININE 1.62*  --  1.65*  --   --  1.41*  TROPONINIHS 415* 306*  --   --   --   --      Estimated Creatinine Clearance: 33.9 mL/min (A) (by C-G formula based on SCr of 1.41 mg/dL (H)).   Medical History: Past Medical History:  Diagnosis Date   Arthritis    Constipation    Depression    Enlarged prostate    Environmental allergies    GERD (gastroesophageal reflux disease)    Hypertension      Assessment: 87 year old male presented with chest pain and shortness of breath. Chest pain associated with exertion, goes away with rest. Cardiology consulted - recommend starting on heparin. No anticoagulation noted PTA. Pharmacy consulted to manage heparin infusion.  Heparin drip 1050 uts/hr with heparin level 0.4 at goal  Cbc stable no bleeding  Plan for cath 7/8  Goal of Therapy:  Heparin level 0.3-0.7 units/ml Monitor platelets by anticoagulation protocol: Yes   Plan:  Continue heparin infusion at 1050 units/hr -Daily CBC and heparin level Monitor s/s  bleeding     Leota Sauers Pharm.D. CPP, BCPS Clinical Pharmacist [phone] 02/23/2023 12:25 PM

## 2023-02-23 NOTE — Progress Notes (Signed)
Progress Note  Patient Name: ALAKI ROSEL Date of Encounter: 02/23/2023  Primary Cardiologist: None   Subjective   Feels well. No chest or arm pain.  Inpatient Medications    Scheduled Meds:  amoxicillin-clavulanate  1 tablet Oral Q12H   aspirin  81 mg Oral Daily   finasteride  5 mg Oral q morning   HYDROcodone-acetaminophen  1 tablet Oral QID   magnesium oxide  400 mg Oral QHS   melatonin  10 mg Oral QHS   metoprolol tartrate  12.5 mg Oral BID   omega-3 acid ethyl esters  1 g Oral Daily   pantoprazole  20 mg Oral Daily   polyethylene glycol  8.5 g Oral Daily   rosuvastatin  10 mg Oral Daily   sodium chloride flush  3 mL Intravenous Q12H   traZODone  100 mg Oral QHS   Continuous Infusions:  heparin 1,050 Units/hr (02/22/23 1617)   PRN Meds: acetaminophen **OR** acetaminophen, labetalol, polyethylene glycol   Vital Signs    Vitals:   02/23/23 0510 02/23/23 0515 02/23/23 0516 02/23/23 0754  BP: 129/68   114/60  Pulse: 66 (!) 59  70  Resp:    16  Temp:   98 F (36.7 C) 97.8 F (36.6 C)  TempSrc:   Oral Oral  SpO2: 99% 97%  97%  Weight:      Height:        Intake/Output Summary (Last 24 hours) at 02/23/2023 0902 Last data filed at 02/23/2023 1610 Gross per 24 hour  Intake 1473.44 ml  Output 775 ml  Net 698.44 ml   Filed Weights   02/21/23 1414 02/22/23 0446 02/23/23 0500  Weight: 73.6 kg 69.3 kg 69.6 kg    Telemetry    Sinus rhythm PVC, lateral TWI - Personally Reviewed  ECG    02/21/2023 - Personally Reviewed  Physical Exam   GEN: No acute distress.   Neck: No JVD Cardiac: RRR, no murmurs, rubs, or gallops.  Respiratory: Clear to auscultation bilaterally. GI: Soft, nontender, non-distended  MS: No edema; No deformity. Neuro:  Nonfocal  Psych: Normal affect   Labs    Chemistry Recent Labs  Lab 02/21/23 1428 02/22/23 0105  NA 138 134*  K 3.8 4.1  CL 105 104  CO2 23 25  GLUCOSE 100* 105*  BUN 26* 23  CREATININE 1.62* 1.65*   CALCIUM 9.5 8.6*  PROT 8.0  --   ALBUMIN 4.4  --   AST 22  --   ALT 12  --   ALKPHOS 79  --   BILITOT 0.6  --   GFRNONAA 42* 41*  ANIONGAP 10 5     Hematology Recent Labs  Lab 02/21/23 1428 02/23/23 0034  WBC 7.3 5.6  RBC 4.58 3.77*  HGB 13.9 11.5*  HCT 41.9 34.7*  MCV 91.5 92.0  MCH 30.3 30.5  MCHC 33.2 33.1  RDW 13.6 13.5  PLT 259 202    Cardiac EnzymesNo results for input(s): "TROPONINI" in the last 168 hours. No results for input(s): "TROPIPOC" in the last 168 hours.   BNP Recent Labs  Lab 02/21/23 1428  BNP 205.8*     DDimer  Recent Labs  Lab 02/21/23 1849  DDIMER 0.46     Summary of Pertinent studies    TTE: 02/22/2023 EF 35-40%. Inferior and basal akinesis  Cardiac cath: Plan for cath Monday  Imaging: CXR subtle opacity of the left lung base. Acute infiltrate not excluded.  Labs: Reviewed - no  BMP today. Will order  Patient Profile     87 y.o. male with history of hypertension who presented to the emergency room with 3 days of intermittent left lower chest wall pain and shortness of breath. He has been having progressive chest pain with exertion.    Assessment & Plan    NSTEMI TnI peaked > 400, trending down Had a fairly typical syndrome Plan in place for cath 7/8 -- NPO after midnight Continue aspirin Continue heparin IV  Continue rosuvastatin -- he seems to be tolerating it well TTE shows inferior and basal akinesis  Elevated creatinine Unknown baseline -- could be AKI vs CKD III Irbesartan held. BP ok (hypotensive over night)  Focal pulmonary infiltrate Per medicine Getting augmentin   For questions or updates, please contact [facility] Please consult [email or web address] for contact info under Cardiology/STEMI.      Signed, Maurice Small, MD 02/23/2023, 9:02 AM

## 2023-02-24 ENCOUNTER — Encounter (HOSPITAL_COMMUNITY)
Admission: EM | Disposition: A | Discharge status: Home / Self Care | Payer: Self-pay | Source: Home / Self Care | Attending: Internal Medicine

## 2023-02-24 DIAGNOSIS — I10 Essential (primary) hypertension: Secondary | ICD-10-CM

## 2023-02-24 DIAGNOSIS — I2510 Atherosclerotic heart disease of native coronary artery without angina pectoris: Secondary | ICD-10-CM

## 2023-02-24 DIAGNOSIS — I214 Non-ST elevation (NSTEMI) myocardial infarction: Secondary | ICD-10-CM | POA: Diagnosis not present

## 2023-02-24 DIAGNOSIS — I472 Ventricular tachycardia, unspecified: Secondary | ICD-10-CM

## 2023-02-24 DIAGNOSIS — I429 Cardiomyopathy, unspecified: Secondary | ICD-10-CM

## 2023-02-24 HISTORY — PX: LEFT HEART CATH AND CORONARY ANGIOGRAPHY: CATH118249

## 2023-02-24 LAB — BASIC METABOLIC PANEL
Anion gap: 8 (ref 5–15)
BUN: 19 mg/dL (ref 8–23)
CO2: 24 mmol/L (ref 22–32)
Calcium: 8.6 mg/dL — ABNORMAL LOW (ref 8.9–10.3)
Chloride: 102 mmol/L (ref 98–111)
Creatinine, Ser: 1.67 mg/dL — ABNORMAL HIGH (ref 0.61–1.24)
GFR, Estimated: 40 mL/min — ABNORMAL LOW (ref >60)
Glucose, Bld: 108 mg/dL — ABNORMAL HIGH (ref 70–99)
Potassium: 3.9 mmol/L (ref 3.5–5.1)
Sodium: 134 mmol/L — ABNORMAL LOW (ref 135–145)

## 2023-02-24 LAB — CBC
HCT: 34.1 % — ABNORMAL LOW (ref 39.0–52.0)
Hemoglobin: 11.1 g/dL — ABNORMAL LOW (ref 13.0–17.0)
MCH: 29.6 pg (ref 26.0–34.0)
MCHC: 32.6 g/dL (ref 30.0–36.0)
MCV: 90.9 fL (ref 80.0–100.0)
Platelets: 196 10*3/uL (ref 150–400)
RBC: 3.75 MIL/uL — ABNORMAL LOW (ref 4.22–5.81)
RDW: 13.6 % (ref 11.5–15.5)
WBC: 6.3 10*3/uL (ref 4.0–10.5)
nRBC: 0 % (ref 0.0–0.2)

## 2023-02-24 LAB — HEPARIN LEVEL (UNFRACTIONATED): Heparin Unfractionated: 0.35 IU/mL (ref 0.30–0.70)

## 2023-02-24 SURGERY — LEFT HEART CATH AND CORONARY ANGIOGRAPHY
Anesthesia: LOCAL

## 2023-02-24 MED ORDER — SODIUM CHLORIDE 0.9% FLUSH: 3.0000 mL | INTRAVENOUS | Status: DC | PRN

## 2023-02-24 MED ORDER — SODIUM CHLORIDE 0.9 % IV SOLN: 250.0000 mL | INTRAVENOUS | Status: DC | PRN

## 2023-02-24 MED ORDER — ASPIRIN 81 MG PO CHEW
81.0000 mg | CHEWABLE_TABLET | ORAL | Status: AC
  Administered 2023-02-24: 81 mg via ORAL

## 2023-02-24 MED ORDER — ONDANSETRON HCL 4 MG/2ML IJ SOLN: 4.0000 mg | Freq: Four times a day (QID) | INTRAMUSCULAR | Status: DC | PRN

## 2023-02-24 MED ORDER — SODIUM CHLORIDE 0.9% FLUSH
3.0000 mL | Freq: Two times a day (BID) | INTRAVENOUS | Status: DC
  Administered 2023-02-27: 3 mL via INTRAVENOUS

## 2023-02-24 MED ORDER — VERAPAMIL HCL 2.5 MG/ML IV SOLN
INTRAVENOUS | Status: DC | PRN
  Administered 2023-02-24: 10 mL via INTRA_ARTERIAL

## 2023-02-24 MED ORDER — FENTANYL CITRATE (PF) 100 MCG/2ML IJ SOLN
INTRAMUSCULAR | Status: AC
  Filled 2023-02-24: qty 2

## 2023-02-24 MED ORDER — CHLORHEXIDINE GLUCONATE CLOTH 2 % EX PADS
6.0000 | MEDICATED_PAD | Freq: Once | CUTANEOUS | Status: AC
  Administered 2023-02-24: 6 via TOPICAL

## 2023-02-24 MED ORDER — VERAPAMIL HCL 2.5 MG/ML IV SOLN
INTRAVENOUS | Status: AC
  Filled 2023-02-24: qty 2

## 2023-02-24 MED ORDER — LIDOCAINE HCL (PF) 1 % IJ SOLN
INTRAMUSCULAR | Status: AC
  Filled 2023-02-24: qty 30

## 2023-02-24 MED ORDER — IOHEXOL 350 MG/ML SOLN
INTRAVENOUS | Status: DC | PRN
  Administered 2023-02-24: 30 mL

## 2023-02-24 MED ORDER — HEPARIN (PORCINE) IN NACL 1000-0.9 UT/500ML-% IV SOLN
INTRAVENOUS | Status: DC | PRN
  Administered 2023-02-24 (×2): 500 mL

## 2023-02-24 MED ORDER — METOPROLOL SUCCINATE ER 25 MG PO TB24
12.5000 mg | ORAL_TABLET | Freq: Every day | ORAL | Status: DC
  Administered 2023-02-24 – 2023-02-26 (×3): 12.5 mg via ORAL
  Filled 2023-02-24 (×3): qty 1

## 2023-02-24 MED ORDER — LABETALOL HCL 5 MG/ML IV SOLN: 10.0000 mg | INTRAVENOUS | Status: AC | PRN

## 2023-02-24 MED ORDER — SODIUM CHLORIDE 0.9 % IV SOLN: INTRAVENOUS | Status: DC

## 2023-02-24 MED ORDER — SODIUM CHLORIDE 0.9% FLUSH
3.0000 mL | Freq: Two times a day (BID) | INTRAVENOUS | Status: DC
  Administered 2023-02-24: 3 mL via INTRAVENOUS

## 2023-02-24 MED ORDER — MIDAZOLAM HCL 2 MG/2ML IJ SOLN
INTRAMUSCULAR | Status: DC | PRN
  Administered 2023-02-24: 1 mg via INTRAVENOUS

## 2023-02-24 MED ORDER — ACETAMINOPHEN 325 MG PO TABS: 650.0000 mg | ORAL_TABLET | ORAL | Status: DC | PRN

## 2023-02-24 MED ORDER — MIDAZOLAM HCL 2 MG/2ML IJ SOLN
INTRAMUSCULAR | Status: AC
  Filled 2023-02-24: qty 2

## 2023-02-24 MED ORDER — HEPARIN SODIUM (PORCINE) 1000 UNIT/ML IJ SOLN
INTRAMUSCULAR | Status: DC | PRN
  Administered 2023-02-24: 5000 [IU] via INTRAVENOUS

## 2023-02-24 MED ORDER — FENTANYL CITRATE (PF) 100 MCG/2ML IJ SOLN
INTRAMUSCULAR | Status: DC | PRN
  Administered 2023-02-24: 25 ug via INTRAVENOUS

## 2023-02-24 MED ORDER — HEPARIN (PORCINE) 25000 UT/250ML-% IV SOLN
1150.0000 [IU]/h | INTRAVENOUS | Status: DC
  Administered 2023-02-24 – 2023-02-26 (×3): 1050 [IU]/h via INTRAVENOUS
  Filled 2023-02-24 (×3): qty 250

## 2023-02-24 MED ORDER — SODIUM CHLORIDE 0.9 % IV SOLN: INTRAVENOUS | Status: AC

## 2023-02-24 MED ORDER — HEPARIN SODIUM (PORCINE) 1000 UNIT/ML IJ SOLN
INTRAMUSCULAR | Status: AC
  Filled 2023-02-24: qty 10

## 2023-02-24 MED ORDER — LIDOCAINE HCL (PF) 1 % IJ SOLN
INTRAMUSCULAR | Status: DC | PRN
  Administered 2023-02-24: 2 mL

## 2023-02-24 MED ORDER — HYDRALAZINE HCL 20 MG/ML IJ SOLN: 10.0000 mg | INTRAMUSCULAR | Status: AC | PRN

## 2023-02-24 SURGICAL SUPPLY — 11 items
CATH INFINITI 5 FR JL3.5 (CATHETERS) IMPLANT
CATH INFINITI 5FR ANG PIGTAIL (CATHETERS) IMPLANT
CATH INFINITI AMBI 6FR TG (CATHETERS) IMPLANT
DEVICE RAD COMP TR BAND LRG (VASCULAR PRODUCTS) IMPLANT
GLIDESHEATH SLEND SS 6F .021 (SHEATH) IMPLANT
GUIDEWIRE INQWIRE 1.5J.035X260 (WIRE) IMPLANT
INQWIRE 1.5J .035X260CM (WIRE) ×1
KIT HEART LEFT (KITS) ×1 IMPLANT
PACK CARDIAC CATHETERIZATION (CUSTOM PROCEDURE TRAY) ×1 IMPLANT
TRANSDUCER W/STOPCOCK (MISCELLANEOUS) ×1 IMPLANT
TUBING CIL FLEX 10 FLL-RA (TUBING) ×1 IMPLANT

## 2023-02-24 NOTE — Care Management Important Message (Signed)
Important Message  Patient Details  Name: Calvin Marks MRN: [id number] Date of Birth: 13-Apr-1936   Medicare Important Message Given:  Yes     [name] 02/24/2023, 8:24 AM

## 2023-02-24 NOTE — Progress Notes (Addendum)
Patient Name: Calvin Marks Date of Encounter: 02/24/2023 [facility] Cardiologist: New to Dr Anne Fu   Interval Summary  .    87 y.o. male with a hx of HTN, BPH, GERD, depression, cardiology is following for NSTEMI and newly discovered LV systolic dysfunction.   Patient states he had some chest tightness this morning, felt anxious about his cath, had questions about cath. He also mentioned his back surgery and is concerned about lying flat. He is urinating well, denied any SOB, leg edema. He states he does not want more Miralax and had numerous bowel movement.   Vital Signs .    Vitals:   02/24/23 0002 02/24/23 0635 02/24/23 0637 02/24/23 0739  BP: 112/65 114/63  (!) 124/55  Pulse:    65  Resp:  18  18  Temp:  97.9 F (36.6 C)  98.4 F (36.9 C)  TempSrc:  Oral  Oral  SpO2:  98% 96% 96%  Weight:   68.9 kg   Height:        Intake/Output Summary (Last 24 hours) at 02/24/2023 0903 Last data filed at 02/24/2023 0800 Gross per 24 hour  Intake 622.79 ml  Output 1175 ml  Net -552.21 ml      02/24/2023    6:37 AM 02/23/2023    5:00 AM 02/22/2023    4:46 AM  Last 3 Weights  Weight (lbs) 151 lb 14.4 oz 153 lb 6.4 oz 152 lb 12.8 oz  Weight (kg) 68.9 kg 69.582 kg 69.31 kg      Telemetry/ECG    Sinus bradycardia mid 40 to 50s, sinus rhythm 60s this morning, 5.3 seconds of NSVT noted   - Personally Reviewed  Physical Exam .   GEN: No acute distress.   Neck: No JVD Cardiac: RRR, no murmurs, rubs, or gallops.  Respiratory: Clear to auscultation bilaterally. GI: Soft, nontender, non-distended  MS: No edema  Assessment & Plan .     NSTEMI Newly discovered LV systolic dysfunction  - presented with chest pain and SOB for 3 days  - Hs trop 415>306 - EKG with old Q wave anteriorly, new TWI of V6 - Echo showed Akinesis of the inferolateral wall and basal inferior wall , LVEF 35-40%, grade I DD, normal RV, moderate MR, trivial AI - planned for LHC today, note CKD IIIb,  renal index stable, use minimal contrast if possible, will monitor closely for CIN, he is euvolemic on exam, will start IVF at 71ml.hr before cath today  - continue heparin gtt,  ASA 81mg , crestor 10mg , will reduce  metoprolol 12.5mg  BID to Toprol XL 12.5mg  daily today given sinus bradycardia noted  ; more rec pending cath findings   Cardiomyopathy  - Echo as above - euvolemic on exam - ischemic workup as above  - GDMT: will transition metoprolol 12.5mg  BID to Toprol XL 12.5mg  daily at time of discharge, BP low normal, may add low dose bidil after cath if BP allows; given renal disease, probably will not add ARNI/MRA/SGLT2I at this time  NSVT - noted on telemetry - start Toprol XL 12.5mg  daily, monitor for sinus bradycardia, if worsen, would stop BB  HTN - on amlodipine 2.5mg , telmisartan-hydrochlorothiazide at home, may stop - continue metoprolol, may add bidil if BP allows   Chronic back pain  GERD BPH - per primary team    Informed Consent   Shared Decision Making/Informed Consent{  The risks [stroke (1 in 1000), death (1 in 1000), kidney failure [usually temporary] (1  in 500), bleeding (1 in 200), allergic reaction [possibly serious] (1 in 200)], benefits (diagnostic support and management of coronary artery disease) and alternatives of a cardiac catheterization were discussed in detail with Calvin Marks and he is willing to proceed.       For questions or updates, please contact [facility] Please consult [email or web address] for contact info under        Signed, Cyndi Bender, NP    History and all data above reviewed.  Patient examined.  I agree with the findings as above.  No chest pain.  No SOB.   Mild cough.  The patient exam reveals COR:RRR  ,  Lungs: Few basilar crackles  ,  Abd: Positive bowel sounds, no rebound no guarding, Ext No edema  .  All available labs, radiology testing, previous records reviewed. Agree with documented assessment and plan.   Acute systolic HF:   NSTEMI. Cath today.  Reviewed with the patient as above.  No LV gram.   CKD IIIA:  Follow creat post procedure.  Discussed with the patient risk of ATN.   Calvin Marks  10:04 AM  02/24/2023

## 2023-02-24 NOTE — Progress Notes (Signed)
ANTICOAGULATION CONSULT NOTE - Follow up Consult  Pharmacy Consult for heparin Indication: chest pain/ACS  Allergies  Allergen Reactions   Lisinopril Other (See Comments)    Caused trouble urinating and soreness in the lower abdomen   Pravastatin Other (See Comments)    Leg cramps   Simvastatin Other (See Comments)    Welts, fever, headaches   Ciprofloxacin Hives and Rash    Patient Measurements: Height: 5\' 5"  (165.1 cm) Weight: 68.9 kg (151 lb 14.4 oz) IBW/kg (Calculated) : 61.5 Heparin Dosing Weight: 73.6 kg  Vital Signs: Temp: 98.9 F (37.2 C) (07/08 1221) Temp Source: Oral (07/08 1221) BP: 112/60 (07/08 1221) Pulse Rate: 65 (07/08 1221)  Labs: Recent Labs    02/21/23 1641 02/22/23 0105 02/22/23 0105 02/22/23 0950 02/23/23 0034 02/23/23 1006 02/24/23 0044 02/24/23 1234  HGB  --   --   --   --  11.5*  --  11.1*  --   HCT  --   --   --   --  34.7*  --  34.1*  --   PLT  --   --   --   --  202  --  196  --   HEPARINUNFRC  --  0.29*   < > 0.27*  --  0.41  --  0.35  CREATININE  --  1.65*  --   --   --  1.41* 1.67*  --   TROPONINIHS 306*  --   --   --   --   --   --   --    < > = values in this interval not displayed.     Estimated Creatinine Clearance: 28.6 mL/min (A) (by C-G formula based on SCr of 1.67 mg/dL (H)).   Medical History: Past Medical History:  Diagnosis Date   Arthritis    Constipation    Depression    Enlarged prostate    Environmental allergies    GERD (gastroesophageal reflux disease)    Hypertension      Assessment: 87 year old male presented with chest pain and shortness of breath. Chest pain associated with exertion, goes away with rest. Cardiology consulted - recommend starting on heparin. No anticoagulation noted PTA. Pharmacy consulted to manage heparin infusion.  Heparin level is 0.35, therapeutic on IV heparin drip 1050 uts/hr. CBC: Hgb 11s stable , pltc wnl stable. No bleeding reported. Plan for cath 7/8  Goal of Therapy:   Heparin level 0.3-0.7 units/ml Monitor platelets by anticoagulation protocol: Yes   Plan:  Continue heparin infusion at 1050 units/hr -Daily CBC and heparin level Monitor s/s bleeding     Noah Delaine, RPh Clinical Pharmacist [phone] 02/24/2023 2:53 PM

## 2023-02-24 NOTE — Progress Notes (Signed)
Heart Failure Nurse Navigator Progress Note  PCP: Renford Dills, MD PCP-Cardiologist: New Dr. Anne Fu Admission Diagnosis: NSTEMI Admitted from: Home  Presentation:   Vista Mink presented with left sided chest pain, shortness of breath x 1 month, BP 145/72, HR 71, BNP 205, Troponin 415, IV heparin started, Cath on 7/8 showed severe multivessel disease, consulted for possible CABG.   Patient and wife were educated on the sign and symptoms of heart failure , daily weights, when to call his doctor or go to the ED, Diet/ fluid restrictions, taking all his medications as prescribed and attending all medical appointments. Patient verbalized his understanding, a HF TOC appointment was scheduled for 03/06/2023 @ 12 noon.   ECHO/ LVEF: 35-40%  Clinical Course:  Past Medical History:  Diagnosis Date   Arthritis    Constipation    Depression    Enlarged prostate    Environmental allergies    GERD (gastroesophageal reflux disease)    Hypertension      Social History   Socioeconomic History   Marital status: Married    Spouse name: Not on file   Number of children: Not on file   Years of education: Not on file   Highest education level: Not on file  Occupational History   Not on file  Tobacco Use   Smoking status: Never   Smokeless tobacco: Not on file  Substance and Sexual Activity   Alcohol use: Yes    Comment: socially   Drug use: No   Sexual activity: Not on file  Other Topics Concern   Not on file  Social History Narrative   Not on file   Social Determinants of Health   Financial Resource Strain: Not on file  Food Insecurity: Not on file  Transportation Needs: Not on file  Physical Activity: Not on file  Stress: Not on file  Social Connections: Not on file    High Risk Criteria for Readmission and/or Poor Patient Outcomes: Heart failure hospital admissions (last 6 months): 0  No Show rate: 0 Difficult social situation: No Demonstrates medication  adherence: Yes Primary Language: English Literacy level: Reading, writing, and comprehension  Barriers of Care:   Diet/ fluid restrictions ( salt/ soup)  Daily weights  Considerations/Referrals:   Referral made to Heart Failure Pharmacist Stewardship: yes Referral made to Heart Failure CSW/NCM TOC: No Referral made to Heart & Vascular TOC clinic: Yes, 03/06/2023 @ 12 noon  Items for Follow-up on DC/TOC: Diet/ fluid restrictions (Salt/soup) Daily weights Continued Hf education   Rhae Hammock, BSN, Scientist, clinical (histocompatibility and immunogenetics) Only

## 2023-02-24 NOTE — Progress Notes (Signed)
PROGRESS NOTE    Calvin Marks  LOV:[id number] DOB: [birth year] DOA: 02/21/2023 PCP: Renford Dills, MD    Brief Narrative:  87 year old with history of hypertension presented to the emergency room with last 3 days of intermittent left lower chest wall pain and likely shortness of breath.  He does have history of hypertension, BPH and GERD as well as depression.  Lives at home.  He is complaining of some symptoms since last 1 month.  In the emergency room hemodynamically stable.  Troponins 415-300 and trending down.  Chest x-ray with subtle opacity left lung base new from previous findings.  Admitted with IV antibiotics, cardiology consulted due to elevated troponins and started on heparin.   Assessment & Plan:   Non-ST elevation MI: Currently hemodynamically stable.  Chest pain-free.   Remains on aspirin, IV heparin, statin.  Echocardiogram with reduced ejection fraction 35%.   Patient is scheduled for left heart catheter today.    Cardiology following.  Acute systolic congestive heart failure: Currently euvolemic.  EF 35%.  Remains on metoprolol, statin.  Suspected pneumonia left lower lobe: Less likely.  Without evidence of fever or leukocytosis.  Will complete Augmentin for 5 days.  Mobilize.  Incentive spirometry.  Hyperlipidemia: LDL 101.  Allergic to simvastatin.  Started on Crestor.  Will monitor.  Tolerating.  CKD stage IIIb: Kidney ultrasound with evidence of medical renal disease likely chronic.  Likely baseline.  Monitor closely with contrasted studies.  DVT prophylaxis: SCDs Start: 02/21/23 1739   Code Status: Full code Family Communication: None at the bedside Disposition Plan: Status is: Inpatient Remains inpatient appropriate because: Heparin infusion, inpatient procedures planned     Consultants:  Cardiology  Procedures:  None  Antimicrobials:  Rocephin and azithromycin 7/5--- 76 Augmentin 7/6---   Subjective:  Patient seen and examined.  Multiple  bowel movement after the full dose of MiraLAX.  Currently denies any other complaints.  Anxious appropriately about the procedure.    Objective: Vitals:   02/24/23 0002 02/24/23 0635 02/24/23 0637 02/24/23 0739  BP: 112/65 114/63  (!) 124/55  Pulse:    65  Resp:  18  18  Temp:  97.9 F (36.6 C)  98.4 F (36.9 C)  TempSrc:  Oral  Oral  SpO2:  98% 96% 96%  Weight:   68.9 kg   Height:        Intake/Output Summary (Last 24 hours) at 02/24/2023 1116 Last data filed at 02/24/2023 0800 Gross per 24 hour  Intake 622.79 ml  Output 1175 ml  Net -552.21 ml    Filed Weights   02/22/23 0446 02/23/23 0500 02/24/23 0637  Weight: 69.3 kg 69.6 kg 68.9 kg    Examination:  General exam: Appears calm and comfortable  Respiratory system: No added sounds.   Cardiovascular system: S1 & S2 heard, RRR.  Gastrointestinal system: Soft.  Nontender. Central nervous system: Alert and oriented. No focal neurological deficits. Extremities: Symmetric 5 x 5 power.    Data Reviewed: I have personally reviewed following labs and imaging studies  CBC: Recent Labs  Lab 02/21/23 1428 02/23/23 0034 02/24/23 0044  WBC 7.3 5.6 6.3  HGB 13.9 11.5* 11.1*  HCT 41.9 34.7* 34.1*  MCV 91.5 92.0 90.9  PLT 259 202 196    Basic Metabolic Panel: Recent Labs  Lab 02/21/23 1428 02/22/23 0105 02/23/23 1006 02/24/23 0044  NA 138 134* 136 134*  K 3.8 4.1 4.1 3.9  CL 105 104 103 102  CO2 23 25 24  24  GLUCOSE 100* 105* 132* 108*  BUN 26* 23 17 19   CREATININE 1.62* 1.65* 1.41* 1.67*  CALCIUM 9.5 8.6* 9.1 8.6*  MG  --  2.2  --   --     GFR: Estimated Creatinine Clearance: 28.6 mL/min (A) (by C-G formula based on SCr of 1.67 mg/dL (H)). Liver Function Tests: Recent Labs  Lab 02/21/23 1428  AST 22  ALT 12  ALKPHOS 79  BILITOT 0.6  PROT 8.0  ALBUMIN 4.4    No results for input(s): "LIPASE", "AMYLASE" in the last 168 hours. No results for input(s): "AMMONIA" in the last 168 hours. Coagulation  Profile: No results for input(s): "INR", "PROTIME" in the last 168 hours. Cardiac Enzymes: No results for input(s): "CKTOTAL", "CKMB", "CKMBINDEX", "TROPONINI" in the last 168 hours. BNP (last 3 results) No results for input(s): "PROBNP" in the last 8760 hours. HbA1C: No results for input(s): "HGBA1C" in the last 72 hours. CBG: No results for input(s): "GLUCAP" in the last 168 hours. Lipid Profile: Recent Labs    02/22/23 0105  CHOL 151  HDL 32*  LDLCALC 101*  TRIG 92  CHOLHDL 4.7    Thyroid Function Tests: No results for input(s): "TSH", "T4TOTAL", "FREET4", "T3FREE", "THYROIDAB" in the last 72 hours. Anemia Panel: No results for input(s): "VITAMINB12", "FOLATE", "FERRITIN", "TIBC", "IRON", "RETICCTPCT" in the last 72 hours. Sepsis Labs: No results for input(s): "PROCALCITON", "LATICACIDVEN" in the last 168 hours.  Recent Results (from the past 240 hour(s))  SARS Coronavirus 2 by RT PCR (hospital order, performed in [facility] hospital lab) *cepheid single result test* Nasopharyngeal Swab     Status: None   Collection Time: 02/21/23  6:19 PM   Specimen: Nasopharyngeal Swab; Nasal Swab  Result Value Ref Range Status   SARS Coronavirus 2 by RT PCR NEGATIVE NEGATIVE Final    Comment: Performed at [facility], [address], [place], [place] [address]  Respiratory (~20 pathogens) panel by PCR     Status: None   Collection Time: 02/21/23  6:19 PM   Specimen: Nasopharyngeal Swab; Respiratory  Result Value Ref Range Status   Adenovirus NOT DETECTED NOT DETECTED Final   Coronavirus 229E NOT DETECTED NOT DETECTED Final    Comment: (NOTE) The Coronavirus on the Respiratory Panel, DOES NOT test for the novel  Coronavirus (2019 nCoV)    Coronavirus HKU1 NOT DETECTED NOT DETECTED Final   Coronavirus NL63 NOT DETECTED NOT DETECTED Final   Coronavirus OC43 NOT DETECTED NOT DETECTED Final   Metapneumovirus NOT DETECTED NOT DETECTED Final   Rhinovirus / Enterovirus NOT  DETECTED NOT DETECTED Final   Influenza A NOT DETECTED NOT DETECTED Final   Influenza B NOT DETECTED NOT DETECTED Final   Parainfluenza Virus 1 NOT DETECTED NOT DETECTED Final   Parainfluenza Virus 2 NOT DETECTED NOT DETECTED Final   Parainfluenza Virus 3 NOT DETECTED NOT DETECTED Final   Parainfluenza Virus 4 NOT DETECTED NOT DETECTED Final   Respiratory Syncytial Virus NOT DETECTED NOT DETECTED Final   Bordetella pertussis NOT DETECTED NOT DETECTED Final   Bordetella Parapertussis NOT DETECTED NOT DETECTED Final   Chlamydophila pneumoniae NOT DETECTED NOT DETECTED Final   Mycoplasma pneumoniae NOT DETECTED NOT DETECTED Final    Comment: Performed at [facility], [address] [place], [place] [address]  Culture, blood (Routine X 2) w Reflex to ID Panel     Status: None (Preliminary result)   Collection Time: 02/21/23  6:49 PM   Specimen: BLOOD RIGHT  ARM  Result Value Ref Range Status   Specimen Description BLOOD RIGHT ARM  Final   Special Requests   Final    BOTTLES DRAWN AEROBIC ONLY Blood Culture results may not be optimal due to an inadequate volume of blood received in culture bottles   Culture   Final    NO GROWTH 3 DAYS Performed at [facility], [address] [place], [place] [address]    Report Status PENDING  Incomplete         Radiology Studies: VAS [place] LOWER EXTREMITY VENOUS (DVT)  Result Date: 02/22/2023  Lower Venous DVT Study Patient Name:  Calvin Marks  Date of Exam:   02/22/2023 Medical Rec #: [id number]          Accession #:    [id number] Date of Birth: 25-Jun-1936          Patient Gender: M Patient Age:   52 years Exam Location:  [facility] Procedure:      VAS [place] LOWER EXTREMITY VENOUS (DVT) Referring Phys: [facility] GOEL --------------------------------------------------------------------------------  Indications: Swelling.  Comparison Study: No prior study on file Performing Technologist: Sherren Kerns RVS  Examination Guidelines: A  complete evaluation includes B-mode imaging, spectral Doppler, color Doppler, and power Doppler as needed of all accessible portions of each vessel. Bilateral testing is considered an integral part of a complete examination. Limited examinations for reoccurring indications may be performed as noted. The reflux portion of the exam is performed with the patient in reverse Trendelenburg.  +---------+---------------+---------+-----------+----------+--------------+ RIGHT    CompressibilityPhasicitySpontaneityPropertiesThrombus Aging +---------+---------------+---------+-----------+----------+--------------+ CFV      Full           Yes      Yes                                 +---------+---------------+---------+-----------+----------+--------------+ SFJ      Full                                                        +---------+---------------+---------+-----------+----------+--------------+ FV Prox  Full                                                        +---------+---------------+---------+-----------+----------+--------------+ FV Mid   Full                                                        +---------+---------------+---------+-----------+----------+--------------+ FV DistalFull                                                        +---------+---------------+---------+-----------+----------+--------------+ PFV      Full                                                        +---------+---------------+---------+-----------+----------+--------------+  POP      Full           Yes      Yes                                 +---------+---------------+---------+-----------+----------+--------------+ PTV      Full                                                        +---------+---------------+---------+-----------+----------+--------------+ PERO     Full                                                         +---------+---------------+---------+-----------+----------+--------------+   +---------+---------------+---------+-----------+----------+--------------+ LEFT     CompressibilityPhasicitySpontaneityPropertiesThrombus Aging +---------+---------------+---------+-----------+----------+--------------+ CFV      Full           Yes      Yes                                 +---------+---------------+---------+-----------+----------+--------------+ SFJ      Full                                                        +---------+---------------+---------+-----------+----------+--------------+ FV Prox  Full                                                        +---------+---------------+---------+-----------+----------+--------------+ FV Mid   Full                                                        +---------+---------------+---------+-----------+----------+--------------+ FV DistalFull                                                        +---------+---------------+---------+-----------+----------+--------------+ PFV      Full                                                        +---------+---------------+---------+-----------+----------+--------------+ POP      Full           Yes      Yes                                 +---------+---------------+---------+-----------+----------+--------------+  PTV      Full                                                        +---------+---------------+---------+-----------+----------+--------------+ PERO     Full                                                        +---------+---------------+---------+-----------+----------+--------------+     Summary: BILATERAL: - No evidence of deep vein thrombosis seen in the lower extremities, bilaterally. -No evidence of popliteal cyst, bilaterally.   *See table(s) above for measurements and observations. Electronically signed by Lemar Livings MD on 02/22/2023 at 3:52:27 PM.     Final    ECHOCARDIOGRAM COMPLETE  Result Date: 02/22/2023    ECHOCARDIOGRAM REPORT   Patient Name:   Calvin Marks Date of Exam: 02/22/2023 Medical Rec #:  [id number]         Height:       65.0 in Accession #:    [id number]        Weight:       152.8 lb Date of Birth:  04-10-1936         BSA:          1.764 m Patient Age:    86 years          BP:           116/58 mmHg Patient Gender: M                 HR:           74 bpm. Exam Location:  Inpatient Procedure: 2D Echo, Cardiac Doppler and Color Doppler Indications:    NSTEMI  History:        Patient has prior history of Echocardiogram examinations, most                 recent 07/09/2017. Acute MI and NSTEMI; CKD.  Sonographer:    Milbert Coulter Referring Phys: [id number] [facility] GOEL IMPRESSIONS  1. Akinesis of the inferolateral wall and basal inferior wall; overall moderate LV dysfunction.  2. Left ventricular ejection fraction, by estimation, is 35 to 40%. The left ventricle has moderately decreased function. The left ventricle demonstrates regional wall motion abnormalities (see scoring diagram/findings for description). Left ventricular  diastolic parameters are consistent with Grade I diastolic dysfunction (impaired relaxation).  3. Right ventricular systolic function is normal. The right ventricular size is normal.  4. The mitral valve is normal in structure. Moderate mitral valve regurgitation. No evidence of mitral stenosis.  5. The aortic valve is tricuspid. Aortic valve regurgitation is trivial. Aortic valve sclerosis is present, with no evidence of aortic valve stenosis.  6. The inferior vena cava is normal in size with greater than 50% respiratory variability, suggesting right atrial pressure of 3 mmHg. FINDINGS  Left Ventricle: Left ventricular ejection fraction, by estimation, is 35 to 40%. The left ventricle has moderately decreased function. The left ventricle demonstrates regional wall motion abnormalities. The left ventricular internal cavity size  was normal in size. There is no left ventricular hypertrophy. Left ventricular diastolic parameters are consistent with Grade I diastolic dysfunction (impaired  relaxation). Right Ventricle: The right ventricular size is normal. Right ventricular systolic function is normal. Left Atrium: Left atrial size was normal in size. Right Atrium: Right atrial size was normal in size. Pericardium: There is no evidence of pericardial effusion. Mitral Valve: The mitral valve is normal in structure. Mild mitral annular calcification. Moderate mitral valve regurgitation. No evidence of mitral valve stenosis. Tricuspid Valve: The tricuspid valve is normal in structure. Tricuspid valve regurgitation is trivial. No evidence of tricuspid stenosis. Aortic Valve: The aortic valve is tricuspid. Aortic valve regurgitation is trivial. Aortic valve sclerosis is present, with no evidence of aortic valve stenosis. Aortic valve mean gradient measures 2.0 mmHg. Aortic valve peak gradient measures 3.5 mmHg. Aortic valve area, by VTI measures 2.36 cm. Pulmonic Valve: The pulmonic valve was normal in structure. Pulmonic valve regurgitation is trivial. No evidence of pulmonic stenosis. Aorta: The aortic root is normal in size and structure. Venous: The inferior vena cava is normal in size with greater than 50% respiratory variability, suggesting right atrial pressure of 3 mmHg. IAS/Shunts: No atrial level shunt detected by color flow Doppler. Additional Comments: Akinesis of the inferolateral wall and basal inferior wall; overall moderate LV dysfunction.  LEFT VENTRICLE PLAX 2D LVIDd:         4.20 cm   Diastology LVIDs:         3.60 cm   LV e' medial:    6.96 cm/s LV PW:         0.90 cm   LV E/e' medial:  9.9 LV IVS:        0.90 cm   LV e' lateral:   8.49 cm/s LVOT diam:     2.10 cm   LV E/e' lateral: 8.1 LV SV:         46 LV SV Index:   26 LVOT Area:     3.46 cm  RIGHT VENTRICLE RV S prime:     19.00 cm/s TAPSE (M-mode): 2.0 cm LEFT ATRIUM              Index        RIGHT ATRIUM           Index LA diam:        3.70 cm 2.10 cm/m   RA Area:     16.10 cm LA Vol (A2C):   46.0 ml 26.07 ml/m  RA Volume:   37.30 ml  21.14 ml/m LA Vol (A4C):   57.4 ml 32.53 ml/m LA Biplane Vol: 52.1 ml 29.53 ml/m  AORTIC VALVE AV Area (Vmax):    2.37 cm AV Area (Vmean):   2.29 cm AV Area (VTI):     2.36 cm AV Vmax:           94.10 cm/s AV Vmean:          62.700 cm/s AV VTI:            0.195 m AV Peak Grad:      3.5 mmHg AV Mean Grad:      2.0 mmHg LVOT Vmax:         64.50 cm/s LVOT Vmean:        41.400 cm/s LVOT VTI:          0.133 m LVOT/AV VTI ratio: 0.68  AORTA Ao Root diam: 3.30 cm MITRAL VALVE MV Area (PHT): 3.83 cm    SHUNTS MV Decel Time: 198 msec    Systemic VTI:  0.13 m MV E velocity: 68.70 cm/s  Systemic  Diam: 2.10 cm MV A velocity: 75.50 cm/s MV E/A ratio:  0.91 Olga Millers MD Electronically signed by Olga Millers MD Signature Date/Time: 02/22/2023/3:46:19 PM    Final         Scheduled Meds:  amoxicillin-clavulanate  1 tablet Oral Q12H   aspirin  81 mg Oral Daily   finasteride  5 mg Oral q morning   HYDROcodone-acetaminophen  1 tablet Oral QID   magnesium oxide  400 mg Oral QHS   melatonin  10 mg Oral QHS   metoprolol succinate  12.5 mg Oral Daily   omega-3 acid ethyl esters  1 g Oral Daily   pantoprazole  20 mg Oral Daily   rosuvastatin  10 mg Oral Daily   sodium chloride flush  3 mL Intravenous Q12H   sodium chloride flush  3 mL Intravenous Q12H   traZODone  100 mg Oral QHS   Continuous Infusions:  sodium chloride     sodium chloride     sodium chloride 50 mL/hr at 02/24/23 1022   heparin 1,050 Units/hr (02/23/23 1515)     LOS: 3 days    Time spent: 35 minutes    Dorcas Carrow, MD [facility]

## 2023-02-24 NOTE — Progress Notes (Signed)
   Heart Failure Stewardship Pharmacist Progress Note   PCP: Renford Dills, MD PCP-Cardiologist: None    HPI:  87 yo M with PMH of HTN, BPH, GERD, and depression.   Presented to the ED on 7/5 with chest pain and shortness of breath. He was in his usual state of health until a month ago when he began noticing epigastric/chest pain with exertion.  BNP slightly elevated. ECHO on 7/6 showed LVEF 35-40%, regional wall motion abnormalities, no LVH, G1DD, moderate MR. Plan for cardiac cath today.   Current HF Medications: Beta Blocker: metoprolol XL 12.5 mg daily  Prior to admission HF Medications: ACE/ARB/ARNI: telmisartan-hydrochlorothiazide 80/25 mg daily *also on amlodipine 2.5 mg daily   Pertinent Lab Values: Serum creatinine 1.67, BUN 19, Potassium 3.9, Sodium 134, BNP 205.8, Magnesium 2.2  Vital Signs: Weight: 151 lbs (admission weight: 152 lbs) Blood pressure: 110/60s  Heart rate: 50-60s  I/O: net +0.4L yesterday; net +1.1L since admission  Medication Assistance / Insurance Benefits Check: Does the patient have prescription insurance?  Yes Type of insurance plan: Investment banker, corporate  Outpatient Pharmacy:  Prior to admission outpatient pharmacy: [facility] Is the patient willing to use [facility] TOC pharmacy at discharge? Yes Is the patient willing to transition their outpatient pharmacy to utilize a [facility] outpatient pharmacy?   Pending    Assessment: 1. Acute systolic CHF (LVEF 35-40%), pending ischemic evaluation. NYHA class II symptoms. - Not volume overloaded on exam, no indications for diuretics. Keep K>4 and Mg>2. - Continue metoprolol XL 12.5 mg daily - GDMT limited by CKD, will need to be cautious since he will be getting contrast today with cath.  - Consider discontinuing amlodipine and hydrochlorothiazide on discharge  Plan: 1) Medication changes recommended at this time: - None pending cath  2) Patient assistance: - Has federal employee insurance -  can use copay cards  3)  Education  - To be completed prior to discharge  Sharen Hones, PharmD, BCPS Heart Failure Engineer, building services Phone [phone]

## 2023-02-24 NOTE — Progress Notes (Signed)
ANTICOAGULATION CONSULT NOTE - Follow up Consult  Pharmacy Consult for heparin Indication: multivessel CAD  Allergies  Allergen Reactions   Lisinopril Other (See Comments)    Caused trouble urinating and soreness in the lower abdomen   Pravastatin Other (See Comments)    Leg cramps   Simvastatin Other (See Comments)    Welts, fever, headaches   Ciprofloxacin Hives and Rash    Patient Measurements: Height: 5\' 5"  (165.1 cm) Weight: 68.9 kg (151 lb 14.4 oz) IBW/kg (Calculated) : 61.5 Heparin Dosing Weight: 73.6 kg  Vital Signs: Temp: 97.8 F (36.6 C) (07/08 1813) Temp Source: Oral (07/08 1813) BP: 140/91 (07/08 1933) Pulse Rate: 62 (07/08 1750)  Labs: Recent Labs    02/22/23 0105 02/22/23 0950 02/23/23 0034 02/23/23 1006 02/24/23 0044 02/24/23 1234  HGB  --   --  11.5*  --  11.1*  --   HCT  --   --  34.7*  --  34.1*  --   PLT  --   --  202  --  196  --   HEPARINUNFRC 0.29* 0.27*  --  0.41  --  0.35  CREATININE 1.65*  --   --  1.41* 1.67*  --      Estimated Creatinine Clearance: 28.6 mL/min (A) (by C-G formula based on SCr of 1.67 mg/dL (H)).   Medical History: Past Medical History:  Diagnosis Date   Arthritis    Constipation    Depression    Enlarged prostate    Environmental allergies    GERD (gastroesophageal reflux disease)    Hypertension      Assessment: 87 year old male presented with chest pain and shortness of breath. Chest pain associated with exertion, goes away with rest. Cardiology consulted - recommend starting on heparin. No anticoagulation noted PTA. Pharmacy consulted to manage heparin infusion.  S/p cath which showed severe multivessel disease. Plan for CVTS consult. TR band removed ~2000. Heparin to restart 2 hours post TR band removal.  Goal of Therapy:  Heparin level 0.3-0.7 units/ml Monitor platelets by anticoagulation protocol: Yes   Plan:  At 2200, restart heparin infusion at 1050 units/hr Will f/u 8hr heparin level  Christoper Fabian, PharmD, BCPS Please see amion for complete clinical pharmacist phone list 02/24/2023 8:08 PM

## 2023-02-25 ENCOUNTER — Encounter (HOSPITAL_COMMUNITY): Payer: Self-pay | Admitting: Internal Medicine

## 2023-02-25 DIAGNOSIS — I214 Non-ST elevation (NSTEMI) myocardial infarction: Secondary | ICD-10-CM

## 2023-02-25 DIAGNOSIS — I2510 Atherosclerotic heart disease of native coronary artery without angina pectoris: Secondary | ICD-10-CM

## 2023-02-25 DIAGNOSIS — I255 Ischemic cardiomyopathy: Secondary | ICD-10-CM

## 2023-02-25 LAB — BASIC METABOLIC PANEL
Anion gap: 7 (ref 5–15)
BUN: 15 mg/dL (ref 8–23)
CO2: 23 mmol/L (ref 22–32)
Calcium: 8.9 mg/dL (ref 8.9–10.3)
Chloride: 104 mmol/L (ref 98–111)
Creatinine, Ser: 1.49 mg/dL — ABNORMAL HIGH (ref 0.61–1.24)
GFR, Estimated: 46 mL/min — ABNORMAL LOW (ref >60)
Glucose, Bld: 115 mg/dL — ABNORMAL HIGH (ref 70–99)
Potassium: 4 mmol/L (ref 3.5–5.1)
Sodium: 134 mmol/L — ABNORMAL LOW (ref 135–145)

## 2023-02-25 LAB — CBC
HCT: 36.4 % — ABNORMAL LOW (ref 39.0–52.0)
Hemoglobin: 12.2 g/dL — ABNORMAL LOW (ref 13.0–17.0)
MCH: 30.7 pg (ref 26.0–34.0)
MCHC: 33.5 g/dL (ref 30.0–36.0)
MCV: 91.5 fL (ref 80.0–100.0)
Platelets: 190 10*3/uL (ref 150–400)
RBC: 3.98 MIL/uL — ABNORMAL LOW (ref 4.22–5.81)
RDW: 13.4 % (ref 11.5–15.5)
WBC: 8 10*3/uL (ref 4.0–10.5)
nRBC: 0 % (ref 0.0–0.2)

## 2023-02-25 LAB — LIPOPROTEIN A (LPA): Lipoprotein (a): 12.1 nmol/L (ref <75.0)

## 2023-02-25 LAB — CULTURE, BLOOD (ROUTINE X 2)

## 2023-02-25 LAB — HEPARIN LEVEL (UNFRACTIONATED)
Heparin Unfractionated: 0.21 IU/mL — ABNORMAL LOW (ref 0.30–0.70)
Heparin Unfractionated: 0.36 IU/mL (ref 0.30–0.70)

## 2023-02-25 MED ORDER — ROSUVASTATIN CALCIUM 20 MG PO TABS
20.0000 mg | ORAL_TABLET | Freq: Every day | ORAL | Status: DC
  Administered 2023-02-26 – 2023-02-27 (×2): 20 mg via ORAL
  Filled 2023-02-25 (×2): qty 1

## 2023-02-25 MED ORDER — GUAIFENESIN-DM 100-10 MG/5ML PO SYRP: 10.0000 mL | ORAL_SOLUTION | Freq: Four times a day (QID) | ORAL | Status: DC | PRN

## 2023-02-25 MED ORDER — DAPAGLIFLOZIN PROPANEDIOL 10 MG PO TABS
10.0000 mg | ORAL_TABLET | Freq: Every day | ORAL | Status: DC
  Administered 2023-02-25 – 2023-02-27 (×3): 10 mg via ORAL
  Filled 2023-02-25 (×3): qty 1

## 2023-02-25 NOTE — Progress Notes (Signed)
ANTICOAGULATION CONSULT NOTE - Follow up Consult  Pharmacy Consult for heparin Indication: multivessel CAD  Allergies  Allergen Reactions   Lisinopril Other (See Comments)    Caused trouble urinating and soreness in the lower abdomen   Pravastatin Other (See Comments)    Leg cramps   Simvastatin Other (See Comments)    Welts, fever, headaches   Ciprofloxacin Hives and Rash    Patient Measurements: Height: 5\' 5"  (165.1 cm) Weight: 68.3 kg (150 lb 9.2 oz) IBW/kg (Calculated) : 61.5 Heparin Dosing Weight: 73.6 kg  Vital Signs: Temp: 97.8 F (36.6 C) (07/09 0759) Temp Source: Oral (07/09 0759) BP: 124/63 (07/09 0759)  Labs: Recent Labs    02/23/23 0034 02/23/23 1006 02/24/23 0044 02/24/23 1234 02/25/23 0047 02/25/23 0742  HGB 11.5*  --  11.1*  --  12.2*  --   HCT 34.7*  --  34.1*  --  36.4*  --   PLT 202  --  196  --  190  --   HEPARINUNFRC  --  0.41  --  0.35 0.21* 0.36  CREATININE  --  1.41* 1.67*  --  1.49*  --      Estimated Creatinine Clearance: 32.1 mL/min (A) (by C-G formula based on SCr of 1.49 mg/dL (H)).   Medical History: Past Medical History:  Diagnosis Date   Arthritis    Constipation    Depression    Enlarged prostate    Environmental allergies    GERD (gastroesophageal reflux disease)    Hypertension      Assessment: 87 year old male presented with chest pain and shortness of breath. Chest pain associated with exertion, goes away with rest. Cardiology consulted - recommend starting on heparin. No anticoagulation noted PTA. Pharmacy consulted to manage heparin infusion.  S/p cath which showed severe multivessel disease. Plan for CVTS consult. TR band removed ~2000 on 7/8. Heparin restarted at 2200, 2 hours post TR band removal. On 7/9, HL 8h following heparin infusion restart at 1050 units/h therapeutic at 0.36. CBC wnl and no signs/symptoms of bleeding reported.   Goal of Therapy:  Heparin level 0.3-0.7 units/ml Monitor platelets by  anticoagulation protocol: Yes   Plan:  Continue heparin infusion at 1050 units/h Heparin level and CBC daily Monitor for signs and symptoms of bleeding  Stephenie Acres, PharmD PGY1 Pharmacy Resident 02/25/2023 8:56 AM

## 2023-02-25 NOTE — Progress Notes (Signed)
PROGRESS NOTE    Calvin Marks  WUJ:[id number] DOB: 11/05/1935 DOA: 02/21/2023 PCP: Renford Dills, MD    Brief Narrative:  87 year old with history of hypertension presented to the emergency room with last 3 days of intermittent left lower chest wall pain and likely shortness of breath.  He does have history of hypertension, BPH and GERD as well as depression.  Lives at home.  He is complaining of some symptoms since last 1 month.  In the emergency room hemodynamically stable.  Troponins 415-300 and trending down.  Chest x-ray with subtle opacity left lung base new from previous findings.  Admitted with IV antibiotics, cardiology consulted due to elevated troponins and started on heparin.   Assessment & Plan:   Non-ST elevation MI: Severe triple-vessel coronary artery disease. Currently hemodynamically stable.  Chest pain-free.   Remains on aspirin, IV heparin, statin.  Echocardiogram with reduced ejection fraction 35%.   Patient underwent cardiac catheterization and found to have severe triple-vessel coronary artery disease.  Recommended revascularization with CT surgery. Cardiology following.  Acute systolic congestive heart failure: Currently euvolemic.  EF 35%.  Remains on metoprolol, statin.  Farxiga added today.  Suspected pneumonia left lower lobe: Less likely.  Without evidence of fever or leukocytosis.  Will complete Augmentin for 5 days.  Mobilize.  Incentive spirometry.  Mucolytic's.  Hyperlipidemia: LDL 101.  Allergic to simvastatin.  Started on Crestor.  Will monitor.  Tolerating.  CKD stage IIIb: Kidney ultrasound with evidence of medical renal disease likely chronic.  Likely baseline.  Monitor closely with contrasted studies.  DVT prophylaxis: SCDs Start: 02/21/23 1739   Code Status: Full code Family Communication: None at the bedside Disposition Plan: Status is: Inpatient Remains inpatient appropriate because: Heparin infusion, inpatient procedures planned      Consultants:  Cardiology  Procedures:  None  Antimicrobials:  Rocephin and azithromycin 7/5--- 76 Augmentin 7/6---   Subjective:  Patient seen and examined.  He was very worried today.  He does have more cough today.  Afebrile.  Feels congested.  Denies any chest pain or palpitations.  Still has low volume frequent loose stool 2-3 a day.  Objective: Vitals:   02/25/23 0023 02/25/23 0444 02/25/23 0451 02/25/23 0759  BP: 121/63 (!) 128/58  124/63  Pulse:      Resp: 17 17  18   Temp: 98.2 F (36.8 C) 98.3 F (36.8 C)  97.8 F (36.6 C)  TempSrc: Axillary Oral  Oral  SpO2: 98% 96%  96%  Weight:   68.3 kg   Height:        Intake/Output Summary (Last 24 hours) at 02/25/2023 1327 Last data filed at 02/25/2023 0630 Gross per 24 hour  Intake 580.33 ml  Output --  Net 580.33 ml    Filed Weights   02/23/23 0500 02/24/23 0637 02/25/23 0451  Weight: 69.6 kg 68.9 kg 68.3 kg    Examination:  General exam: Appears calm and comfortable but anxious. Respiratory system: No added sounds.   Cardiovascular system: S1 & S2 heard, RRR.  Gastrointestinal system: Soft.  Nontender. Central nervous system: Alert and oriented. No focal neurological deficits. Extremities: Symmetric 5 x 5 power.    Data Reviewed: I have personally reviewed following labs and imaging studies  CBC: Recent Labs  Lab 02/21/23 1428 02/23/23 0034 02/24/23 0044 02/25/23 0047  WBC 7.3 5.6 6.3 8.0  HGB 13.9 11.5* 11.1* 12.2*  HCT 41.9 34.7* 34.1* 36.4*  MCV 91.5 92.0 90.9 91.5  PLT 259 202 196 190  Basic Metabolic Panel: Recent Labs  Lab 02/21/23 1428 02/22/23 0105 02/23/23 1006 02/24/23 0044 02/25/23 0047  NA 138 134* 136 134* 134*  K 3.8 4.1 4.1 3.9 4.0  CL 105 104 103 102 104  CO2 23 25 24 24 23   GLUCOSE 100* 105* 132* 108* 115*  BUN 26* 23 17 19 15   CREATININE 1.62* 1.65* 1.41* 1.67* 1.49*  CALCIUM 9.5 8.6* 9.1 8.6* 8.9  MG  --  2.2  --   --   --     GFR: Estimated Creatinine  Clearance: 32.1 mL/min (A) (by C-G formula based on SCr of 1.49 mg/dL (H)). Liver Function Tests: Recent Labs  Lab 02/21/23 1428  AST 22  ALT 12  ALKPHOS 79  BILITOT 0.6  PROT 8.0  ALBUMIN 4.4    No results for input(s): "LIPASE", "AMYLASE" in the last 168 hours. No results for input(s): "AMMONIA" in the last 168 hours. Coagulation Profile: No results for input(s): "INR", "PROTIME" in the last 168 hours. Cardiac Enzymes: No results for input(s): "CKTOTAL", "CKMB", "CKMBINDEX", "TROPONINI" in the last 168 hours. BNP (last 3 results) No results for input(s): "PROBNP" in the last 8760 hours. HbA1C: No results for input(s): "HGBA1C" in the last 72 hours. CBG: No results for input(s): "GLUCAP" in the last 168 hours. Lipid Profile: No results for input(s): "CHOL", "HDL", "LDLCALC", "TRIG", "CHOLHDL", "LDLDIRECT" in the last 72 hours.  Thyroid Function Tests: No results for input(s): "TSH", "T4TOTAL", "FREET4", "T3FREE", "THYROIDAB" in the last 72 hours. Anemia Panel: No results for input(s): "VITAMINB12", "FOLATE", "FERRITIN", "TIBC", "IRON", "RETICCTPCT" in the last 72 hours. Sepsis Labs: No results for input(s): "PROCALCITON", "LATICACIDVEN" in the last 168 hours.  Recent Results (from the past 240 hour(s))  SARS Coronavirus 2 by RT PCR (hospital order, performed in [facility] hospital lab) *cepheid single result test* Nasopharyngeal Swab     Status: None   Collection Time: 02/21/23  6:19 PM   Specimen: Nasopharyngeal Swab; Nasal Swab  Result Value Ref Range Status   SARS Coronavirus 2 by RT PCR NEGATIVE NEGATIVE Final    Comment: Performed at [facility], [address] [place], [place] [address]  Respiratory (~20 pathogens) panel by PCR     Status: None   Collection Time: 02/21/23  6:19 PM   Specimen: Nasopharyngeal Swab; Respiratory  Result Value Ref Range Status   Adenovirus NOT DETECTED NOT DETECTED Final   Coronavirus 229E NOT DETECTED NOT DETECTED Final     Comment: (NOTE) The Coronavirus on the Respiratory Panel, DOES NOT test for the novel  Coronavirus (2019 nCoV)    Coronavirus HKU1 NOT DETECTED NOT DETECTED Final   Coronavirus NL63 NOT DETECTED NOT DETECTED Final   Coronavirus OC43 NOT DETECTED NOT DETECTED Final   Metapneumovirus NOT DETECTED NOT DETECTED Final   Rhinovirus / Enterovirus NOT DETECTED NOT DETECTED Final   Influenza A NOT DETECTED NOT DETECTED Final   Influenza B NOT DETECTED NOT DETECTED Final   Parainfluenza Virus 1 NOT DETECTED NOT DETECTED Final   Parainfluenza Virus 2 NOT DETECTED NOT DETECTED Final   Parainfluenza Virus 3 NOT DETECTED NOT DETECTED Final   Parainfluenza Virus 4 NOT DETECTED NOT DETECTED Final   Respiratory Syncytial Virus NOT DETECTED NOT DETECTED Final   Bordetella pertussis NOT DETECTED NOT DETECTED Final   Bordetella Parapertussis NOT DETECTED NOT DETECTED Final   Chlamydophila pneumoniae NOT DETECTED NOT DETECTED Final   Mycoplasma pneumoniae NOT DETECTED NOT DETECTED Final    Comment: Performed  at [facility], [address], [place], [place] [address]  Culture, blood (Routine X 2) w Reflex to ID Panel     Status: None (Preliminary result)   Collection Time: 02/21/23  6:49 PM   Specimen: BLOOD RIGHT ARM  Result Value Ref Range Status   Specimen Description BLOOD RIGHT ARM  Final   Special Requests   Final    BOTTLES DRAWN AEROBIC ONLY Blood Culture results may not be optimal due to an inadequate volume of blood received in culture bottles   Culture   Final    NO GROWTH 4 DAYS Performed at [facility], [address] [place], [place] [address]    Report Status PENDING  Incomplete         Radiology Studies: CARDIAC CATHETERIZATION  Result Date: 02/24/2023   Prox RCA lesion is 70% stenosed.   RPDA lesion is 100% stenosed.   Ost LM lesion is 40% stenosed.   Ost LAD to Prox LAD lesion is 90% stenosed.   Mid LAD lesion is 95% stenosed.   1st Diag-1 lesion is 95% stenosed.    1st Diag-2 lesion is 90% stenosed.   Ost Cx to Prox Cx lesion is 80% stenosed.   Prox Cx to Mid Cx lesion is 95% stenosed. 1.  Severe multivessel disease. 2.  LVEDP of 17 mmHg. Recommendation: High complexity disease best served by surgical revascularization.        Scheduled Meds:  amoxicillin-clavulanate  1 tablet Oral Q12H   aspirin  81 mg Oral Daily   dapagliflozin propanediol  10 mg Oral Daily   finasteride  5 mg Oral q morning   HYDROcodone-acetaminophen  1 tablet Oral QID   magnesium oxide  400 mg Oral QHS   melatonin  10 mg Oral QHS   metoprolol succinate  12.5 mg Oral Daily   omega-3 acid ethyl esters  1 g Oral Daily   pantoprazole  20 mg Oral Daily   [START ON 02/26/2023] rosuvastatin  20 mg Oral Daily   sodium chloride flush  3 mL Intravenous Q12H   sodium chloride flush  3 mL Intravenous Q12H   traZODone  100 mg Oral QHS   Continuous Infusions:  sodium chloride     heparin 1,050 Units/hr (02/25/23 0630)     LOS: 4 days    Time spent: 35 minutes    Dorcas Carrow, MD [facility]

## 2023-02-25 NOTE — Progress Notes (Signed)
Progress Note  Patient Name: Calvin Marks Date of Encounter: 02/25/2023  Primary Cardiologist:   None   Subjective   Complaining of diarrhea. No chest pain.  Positive non productive cough  Inpatient Medications    Scheduled Meds:  amoxicillin-clavulanate  1 tablet Oral Q12H   aspirin  81 mg Oral Daily   finasteride  5 mg Oral q morning   HYDROcodone-acetaminophen  1 tablet Oral QID   magnesium oxide  400 mg Oral QHS   melatonin  10 mg Oral QHS   metoprolol succinate  12.5 mg Oral Daily   omega-3 acid ethyl esters  1 g Oral Daily   pantoprazole  20 mg Oral Daily   rosuvastatin  10 mg Oral Daily   sodium chloride flush  3 mL Intravenous Q12H   sodium chloride flush  3 mL Intravenous Q12H   traZODone  100 mg Oral QHS   Continuous Infusions:  sodium chloride     heparin 1,050 Units/hr (02/25/23 0630)   PRN Meds: sodium chloride, acetaminophen **OR** acetaminophen, acetaminophen, labetalol, ondansetron (ZOFRAN) IV, polyethylene glycol, sodium chloride flush   Vital Signs    Vitals:   02/25/23 0023 02/25/23 0444 02/25/23 0451 02/25/23 0759  BP: 121/63 (!) 128/58  124/63  Pulse:      Resp: 17 17  18   Temp: 98.2 F (36.8 C) 98.3 F (36.8 C)  97.8 F (36.6 C)  TempSrc: Axillary Oral  Oral  SpO2: 98% 96%  96%  Weight:   68.3 kg   Height:        Intake/Output Summary (Last 24 hours) at 02/25/2023 0916 Last data filed at 02/25/2023 0630 Gross per 24 hour  Intake 703.16 ml  Output --  Net 703.16 ml   Filed Weights   02/23/23 0500 02/24/23 0637 02/25/23 0451  Weight: 69.6 kg 68.9 kg 68.3 kg    Telemetry    NSR - Personally Reviewed  ECG    NA - Personally Reviewed  Physical Exam   GEN: No acute distress.   Neck: No  JVD Cardiac: RRR, no murmurs, rubs, or gallops.  Respiratory: Clear  to auscultation bilaterally. GI: Soft, nontender, non-distended  MS: No  edema; No deformity.  Right radial without bleeding or bruising Neuro:  Nonfocal  Psych:  Normal affect   Labs    Chemistry Recent Labs  Lab 02/21/23 1428 02/22/23 0105 02/23/23 1006 02/24/23 0044 02/25/23 0047  NA 138   < > 136 134* 134*  K 3.8   < > 4.1 3.9 4.0  CL 105   < > 103 102 104  CO2 23   < > 24 24 23   GLUCOSE 100*   < > 132* 108* 115*  BUN 26*   < > 17 19 15   CREATININE 1.62*   < > 1.41* 1.67* 1.49*  CALCIUM 9.5   < > 9.1 8.6* 8.9  PROT 8.0  --   --   --   --   ALBUMIN 4.4  --   --   --   --   AST 22  --   --   --   --   ALT 12  --   --   --   --   ALKPHOS 79  --   --   --   --   BILITOT 0.6  --   --   --   --   GFRNONAA 42*   < > 49* 40* 46*  ANIONGAP 10   < >  9 8 7    < > = values in this interval not displayed.     Hematology Recent Labs  Lab 02/23/23 0034 02/24/23 0044 02/25/23 0047  WBC 5.6 6.3 8.0  RBC 3.77* 3.75* 3.98*  HGB 11.5* 11.1* 12.2*  HCT 34.7* 34.1* 36.4*  MCV 92.0 90.9 91.5  MCH 30.5 29.6 30.7  MCHC 33.1 32.6 33.5  RDW 13.5 13.6 13.4  PLT 202 196 190    Cardiac EnzymesNo results for input(s): "TROPONINI" in the last 168 hours. No results for input(s): "TROPIPOC" in the last 168 hours.   BNP Recent Labs  Lab 02/21/23 1428  BNP 205.8*     DDimer  Recent Labs  Lab 02/21/23 1849  DDIMER 0.46     Radiology    CARDIAC CATHETERIZATION  Result Date: 02/24/2023   Prox RCA lesion is 70% stenosed.   RPDA lesion is 100% stenosed.   Ost LM lesion is 40% stenosed.   Ost LAD to Prox LAD lesion is 90% stenosed.   Mid LAD lesion is 95% stenosed.   1st Diag-1 lesion is 95% stenosed.   1st Diag-2 lesion is 90% stenosed.   Ost Cx to Prox Cx lesion is 80% stenosed.   Prox Cx to Mid Cx lesion is 95% stenosed. 1.  Severe multivessel disease. 2.  LVEDP of 17 mmHg. Recommendation: High complexity disease best served by surgical revascularization.    Cardiac Studies   Diagnostic Dominance: Right   Patient Profile     87 y.o. male with a hx of HTN, BPH, GERD, depression, cardiology is following for NSTEMI and newly discovered LV  systolic dysfunction.  CAD as above.   Assessment & Plan    NSTEMI:    CAD as above.    TCTS consult.     Acute systolic HF:    Ischemic cardiomyopathy. Seems to be euvolemic.    HTN:  This is being managed in the context of treating his CHF.  Titrate beta blocker and add [place].     CKD:   Follow creat post dye.  Will hold off on Entresto but start after surgery pending renal function.   Dyslipidemia:   Increase Crestor.   For questions or updates, please contact [facility] Please consult [email or web address] for contact info under Cardiology/STEMI.   Signed, Rollene Rotunda, MD  02/25/2023, 9:16 AM

## 2023-02-25 NOTE — Plan of Care (Signed)

## 2023-02-25 NOTE — Consult Note (Cosign Needed)
[address]       [place] [address]             [phone]        JRAKE RODRIQUEZ [facility] Medical Record #[id number] Date of Birth: 1936-02-15  Referring:  Rollene Rotunda, MD  Primary Care: Renford Dills, MD Primary Cardiologist: None  Reason for consult: Evaluation for coronary bypass grafting for multivessel coronary artery disease after presenting with acute non-ST elevation myocardial infarction.    History of Present Illness:  Calvin Marks is an 87 year old gentleman with a past history notable for hypertension, dyslipidemia with history of statin intolerance, gastroesophageal flux disease, benign prostatic hyperplasia, and history of depression.  He has a remote history of tobacco use but has not smoked in about 50 years.  He presented to the emergency room late last week reporting he had been having chest and epigastric discomfort with exertion such as mowing his lawn beginning about 1 month ago.  About 1 week prior to admission, he was awakened from sleep with chest discomfort radiating to his left arm.  Evaluation in the emergency room included high-sensitivity troponin elevated at 415 with later measurement of 300.  EKG showed old anterior Q waves that were present in 2015 but there were new T wave inversions in V6.  Chest x-ray showed mild left lower lobe opacification but was otherwise unremarkable.  Admission lab data showed a creatinine of 1.62 but was otherwise unremarkable.  He was admitted to the hospital by the hospitalist service and started on IV antibiotics for a possible left lower lobe infiltrate.  He was also started on IV heparin for acute coronary syndrome. Since admission, he has had a few more episodes of chest tightness but overall has been stable.  He had an echocardiogram on 02/22/2023 showing new inferior and small segment akinesis with an ejection fraction of 35 to 40%.  Has grade 1 diastolic dysfunction, normal RV function,  and trivial aortic insufficiency.  After IV hydration and treatment of the suspected left lower lobe pneumonia for a few days, he was taken to the Cath Lab earlier today for left heart catheterization.  This study demonstrates severe three-vessel coronary artery disease gnosis followed by 90% proximal and 95% mid LAD stenoses.  The diagonal has tandem 95% and 90% stenoses.  Circumflex has tandem 80% proximal and 95% mid stenoses.  The right coronary artery has a proximal 70% stenosis and total occlusion of the proximal posterior descending coronary artery.  There are several septal left-to-right collaterals. CT surgery has been asked to evaluate Calvin Marks for consideration of coronary bypass grafting for his multivessel coronary disease. Currently, Calvin Marks is resting in bed with his wife at the bedside.  He denies any chest pain or shortness of breath.  He does complain of having developed some respiratory congestion and diarrhea since admission.  He remains fairly active and has no mobility issues but admits to having difficulty lying flat due to chronic back issues.  He has not seen a dentist in many years but said he did not have any teeth that were bothering him currently. Mr. 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    Current Activity/ Functional Status: Patient is independent with mobility/ambulation, transfers, ADL's, IADL's.   Zubrod Score: At the time of surgery this patient's most appropriate activity status/level should be described as: []   0    Normal activity, no symptoms [x]     1    Restricted in physical strenuous activity but ambulatory, able to do out light work []     2    Ambulatory and capable of self care, unable to do work activities, up and about                 more than 50%  Of the time                            []     3    Only limited self care, in bed greater than 50% of waking  hours []     4    Completely disabled, no self care, confined to bed or chair []     5    Moribund  Past Medical History:  Diagnosis Date   Arthritis    Constipation    Depression    Enlarged prostate    Environmental allergies    GERD (gastroesophageal reflux disease)    Hypertension     Past Surgical History:  Procedure Laterality Date   BACK SURGERY     COLONOSCOPY     DENTAL SURGERY     LEFT HEART CATH AND CORONARY ANGIOGRAPHY N/A 02/24/2023   Procedure: LEFT HEART CATH AND CORONARY ANGIOGRAPHY;  Surgeon: Orbie Pyo, MD;  Location: [facility] INVASIVE [facility] LAB;  Service: Cardiovascular;  Laterality: N/A;    Social History   Tobacco Use  Smoking Status Never  Smokeless Tobacco Not on file    Social History   Substance and Sexual Activity  Alcohol Use Yes   Comment: socially     Allergies  Allergen Reactions   Lisinopril Other (See Comments)    Caused trouble urinating and soreness in the lower abdomen   Pravastatin Other (See Comments)    Leg cramps   Simvastatin Other (See Comments)    Welts, fever, headaches   Ciprofloxacin Hives and Rash    Current Facility-Administered Medications  Medication Dose Route Frequency Provider Last Rate Last Admin   0.9 %  sodium chloride infusion  250 mL Intravenous PRN Orbie Pyo, MD       acetaminophen (TYLENOL) tablet 650 mg  650 mg Oral Q6H PRN Nolberto Hanlon, MD       Or   acetaminophen (TYLENOL) suppository 650 mg  650 mg Rectal Q6H PRN Nolberto Hanlon, MD       acetaminophen (TYLENOL) tablet 650 mg  650 mg Oral Q4H PRN Orbie Pyo, MD       amoxicillin-clavulanate (AUGMENTIN) 875-125 MG per tablet 1 tablet  1 tablet Oral Q12H Dorcas Carrow, MD   1 tablet at 02/25/23 0841   aspirin chewable tablet 81 mg  81 mg Oral Daily Nolberto Hanlon, MD   81 mg at 02/25/23 0840   dapagliflozin propanediol (FARXIGA) tablet 10 mg  10 mg Oral Daily Rollene Rotunda, MD   10 mg at 02/25/23 1048   finasteride (PROSCAR) tablet 5 mg  5 mg Oral  q morning Nolberto Hanlon, MD   5 mg at 02/25/23 0841   guaiFENesin-dextromethorphan (ROBITUSSIN DM) 100-10 MG/5ML syrup 10 mL  10 mL Oral Q6H PRN Dorcas Carrow, MD       heparin ADULT infusion 100 units/mL (25000 units/250mL)  1,050 Units/hr Intravenous Continuous Titus Mould, RPH 10.5 mL/hr at 02/25/23 0630 1,050 Units/hr at 02/25/23 0630   HYDROcodone-acetaminophen (NORCO) 7.5-325 MG per tablet  1 tablet  1 tablet Oral QID Nolberto Hanlon, MD   1 tablet at 02/25/23 0840   labetalol (NORMODYNE) injection 20 mg  20 mg Intravenous Q2H PRN Nolberto Hanlon, MD       magnesium oxide (MAG-OX) tablet 400 mg  400 mg Oral QHS Nolberto Hanlon, MD   400 mg at 02/24/23 2051   melatonin tablet 10 mg  10 mg Oral Sherene Sires, MD   10 mg at 02/24/23 2051   metoprolol succinate (TOPROL-XL) 24 hr tablet 12.5 mg  12.5 mg Oral Daily Cyndi Bender, NP   12.5 mg at 02/25/23 0841   omega-3 acid ethyl esters (LOVAZA) capsule 1 g  1 g Oral Daily Nolberto Hanlon, MD   1 g at 02/25/23 0841   ondansetron (ZOFRAN) injection 4 mg  4 mg Intravenous Q6H PRN Orbie Pyo, MD       pantoprazole (PROTONIX) EC tablet 20 mg  20 mg Oral Daily Nolberto Hanlon, MD   20 mg at 02/25/23 0840   [START ON 02/26/2023] rosuvastatin (CRESTOR) tablet 20 mg  20 mg Oral Daily Rollene Rotunda, MD       sodium chloride flush (NS) 0.9 % injection 3 mL  3 mL Intravenous Q12H Nolberto Hanlon, MD   3 mL at 02/25/23 0846   sodium chloride flush (NS) 0.9 % injection 3 mL  3 mL Intravenous Q12H Orbie Pyo, MD       sodium chloride flush (NS) 0.9 % injection 3 mL  3 mL Intravenous PRN Orbie Pyo, MD       traZODone (DESYREL) tablet 100 mg  100 mg Oral QHS Nolberto Hanlon, MD   100 mg at 02/24/23 2051    Medications Prior to Admission  Medication Sig Dispense Refill Last Dose   amLODipine (NORVASC) 2.5 MG tablet Take 2.5 mg by mouth in the morning.   02/21/2023 at am   finasteride (PROSCAR) 5 MG tablet Take 5 mg by mouth every morning.   02/21/2023   fish oil-omega-3  fatty acids 1000 MG capsule Take 1 g by mouth every morning.   02/21/2023 at am   HYDROcodone-acetaminophen (NORCO) 7.5-325 MG tablet Take 1 tablet by mouth 4 (four) times daily.   02/21/2023 at am   lansoprazole (PREVACID) 30 MG capsule Take 30 mg by mouth daily in the afternoon.   02/20/2023   magnesium oxide (MAG-OX) 400 (240 Mg) MG tablet Take 400 mg by mouth at bedtime.   02/20/2023 at pm   Melatonin 10 MG TABS Take 10 mg by mouth at bedtime.   02/20/2023 at pm   meloxicam (MOBIC) 15 MG tablet Take 15 mg by mouth daily.   02/21/2023 at am   polyethylene glycol (MIRALAX / GLYCOLAX) packet Take 8.5 g by mouth in the morning.   02/21/2023   telmisartan-hydrochlorothiazide (MICARDIS HCT) 80-25 MG tablet Take 1 tablet by mouth in the morning.   02/21/2023 at am   traZODone (DESYREL) 100 MG tablet Take 100 mg by mouth at bedtime.   02/20/2023 at pm    History reviewed. No pertinent family history.   Review of Systems:      Cardiac Review of Systems: Y or  [    ]= no  Chest Pain [  x  ]  Resting SOB [x   ] Exertional SOB  [ x ]  Orthopnea [  ]   Pedal Edema [   ]    Palpitations [  ] Syncope  [  ]  Presyncope [   ]  General Review of Systems: [Y] = yes [  ]=no Constitional: recent weight change [  ]; anorexia [  ]; fatigue [  ]; nausea [  ]; night sweats [  ]; fever [  ]; or chills [  ]                                                               Dental: Last Dentist visit: Years ago  Eye : blurred vision [  ]; diplopia [   ]; vision changes [  ];  Amaurosis fugax[  ]; Resp: cough [  ];  wheezing[  ];  hemoptysis[  ]; shortness of breath[  ]; paroxysmal nocturnal dyspnea[  ]; dyspnea on exertion[  ]; or orthopnea[  ];  GI:  gallstones[  ], vomiting[  ];  dysphagia[  ]; melena[  ];  hematochezia [  ]; heartburn[  ];   Hx of  Colonoscopy[  ]; GU: kidney stones [  ]; hematuria[  ];   dysuria [  ];  nocturia[  ];  history of     obstruction [  ]; urinary frequency [  ]             Skin: rash, swelling[  ];,  hair loss[  ];  peripheral edema[  ];  or itching[  ]; Musculosketetal: myalgias[  ];  joint swelling[  ];  joint erythema[  ];  joint pain[  ];  back pain[  ];  Heme/Lymph: bruising[  ];  bleeding[  ];  anemia[  ];  Neuro: TIA[  ];  headaches[  ];  stroke[  ];  vertigo[  ];  seizures[  ];   paresthesias[  ];  difficulty walking[  ];  Psych:depression[  ]; anxiety[  ];  Endocrine: diabetes[  ];  thyroid dysfunction[  ];                Physical Exam: BP 124/63 (BP Location: Left Arm)   Pulse 62   Temp 97.8 F (36.6 C) (Oral)   Resp 18   Ht 5\' 5"  (1.651 m)   Wt 68.3 kg   SpO2 96%   BMI 25.06 kg/m    General appearance: alert, cooperative, and no distress Head: Normocephalic, without obvious abnormality, atraumatic Neck: no adenopathy, no carotid bruit, no JVD, and supple, symmetrical, trachea midline Lymph nodes: No clavicular or cervical adenopathy Resp: Normal work of breathing at rest.  Breath sounds are full, equal, and clear to auscultation Cardio: Regular rate and rhythm, no murmur GI: Soft, flat, nontender, normal bowel sounds Extremities: No obvious deformity.  All are warm and well-perfused with palpable distal pulses.  No obvious varicosities Neurologic: Grossly normal  Diagnostic Studies & Laboratory data:  LEFT HEART CATH AND CORONARY ANGIOGRAPHY   Conclusion      Prox RCA lesion is 70% stenosed.   RPDA lesion is 100% stenosed.   Ost LM lesion is 40% stenosed.   Ost LAD to Prox LAD lesion is 90% stenosed.   Mid LAD lesion is 95% stenosed.   1st Diag-1 lesion is 95% stenosed.   1st Diag-2 lesion is 90% stenosed.   Ost Cx to Prox Cx lesion is 80% stenosed.   Prox Cx to Mid Cx lesion  is 95% stenosed.   1.  Severe multivessel disease. 2.  LVEDP of 17 mmHg.   Recommendation: High complexity disease best served by surgical revascularization.    Coronary Findings  Diagnostic Dominance: Right Left Main  Ost LM lesion is 40% stenosed.    Left Anterior  Descending  Ost LAD to Prox LAD lesion is 90% stenosed.  Mid LAD lesion is 95% stenosed.    First Diagonal Branch  1st Diag-1 lesion is 95% stenosed.  1st Diag-2 lesion is 90% stenosed.    Left Circumflex  Ost Cx to Prox Cx lesion is 80% stenosed.  Prox Cx to Mid Cx lesion is 95% stenosed.    Right Coronary Artery  Prox RCA lesion is 70% stenosed.    Right Posterior Descending Artery  Collaterals  RPDA filled by collaterals from 2nd Sept.    Collaterals  RPDA filled by collaterals from 2nd Sept.    Collaterals  RPDA filled by collaterals from 1st Sept.    RPDA lesion is 100% stenosed.    Intervention   No interventions have been documented.   Coronary Diagrams  Diagnostic Dominance: Right         ECHOCARDIOGRAM REPORT       Patient Name:   Calvin Marks Date of Exam: 02/22/2023  Medical Rec #:  [id number]         Height:       65.0 in  Accession #:    [id number]        Weight:       152.8 lb  Date of Birth:  May 15, 1936         BSA:          1.764 m  Patient Age:    86 years          BP:           116/58 mmHg  Patient Gender: M                 HR:           74 bpm.  Exam Location:  Inpatient   Procedure: 2D Echo, Cardiac Doppler and Color Doppler   Indications:    NSTEMI    History:        Patient has prior history of Echocardiogram examinations,  most                 recent 07/09/2017. Acute MI and NSTEMI; CKD.    Sonographer:    Milbert Coulter  Referring Phys: [id number] [facility] GOEL   IMPRESSIONS     1. Akinesis of the inferolateral wall and basal inferior wall; overall  moderate LV dysfunction.   2. Left ventricular ejection fraction, by estimation, is 35 to 40%. The  left ventricle has moderately decreased function. The left ventricle  demonstrates regional wall motion abnormalities (see scoring  diagram/findings for description). Left ventricular   diastolic parameters are consistent with Grade I diastolic dysfunction  (impaired relaxation).    3. Right ventricular systolic function is normal. The right ventricular  size is normal.   4. The mitral valve is normal in structure. Moderate mitral valve  regurgitation. No evidence of mitral stenosis.   5. The aortic valve is tricuspid. Aortic valve regurgitation is trivial.  Aortic valve sclerosis is present, with no evidence of aortic valve  stenosis.   6. The inferior vena cava is normal in size with greater than 50%  respiratory variability, suggesting right atrial pressure of 3 mmHg.  FINDINGS   Left Ventricle: Left ventricular ejection fraction, by estimation, is 35  to 40%. The left ventricle has moderately decreased function. The left  ventricle demonstrates regional wall motion abnormalities. The left  ventricular internal cavity size was  normal in size. There is no left ventricular hypertrophy. Left ventricular  diastolic parameters are consistent with Grade I diastolic dysfunction  (impaired relaxation).   Right Ventricle: The right ventricular size is normal. Right ventricular  systolic function is normal.   Left Atrium: Left atrial size was normal in size.   Right Atrium: Right atrial size was normal in size.   Pericardium: There is no evidence of pericardial effusion.   Mitral Valve: The mitral valve is normal in structure. Mild mitral annular  calcification. Moderate mitral valve regurgitation. No evidence of mitral  valve stenosis.   Tricuspid Valve: The tricuspid valve is normal in structure. Tricuspid  valve regurgitation is trivial. No evidence of tricuspid stenosis.   Aortic Valve: The aortic valve is tricuspid. Aortic valve regurgitation is  trivial. Aortic valve sclerosis is present, with no evidence of aortic  valve stenosis. Aortic valve mean gradient measures 2.0 mmHg. Aortic valve  peak gradient measures 3.5 mmHg.  Aortic valve area, by VTI measures 2.36 cm.   Pulmonic Valve: The pulmonic valve was normal in structure. Pulmonic valve   regurgitation is trivial. No evidence of pulmonic stenosis.   Aorta: The aortic root is normal in size and structure.   Venous: The inferior vena cava is normal in size with greater than 50%  respiratory variability, suggesting right atrial pressure of 3 mmHg.   IAS/Shunts: No atrial level shunt detected by color flow Doppler.   Additional Comments: Akinesis of the inferolateral wall and basal inferior  wall; overall moderate LV dysfunction.     LEFT VENTRICLE  PLAX 2D  LVIDd:         4.20 cm   Diastology  LVIDs:         3.60 cm   LV e' medial:    6.96 cm/s  LV PW:         0.90 cm   LV E/e' medial:  9.9  LV IVS:        0.90 cm   LV e' lateral:   8.49 cm/s  LVOT diam:     2.10 cm   LV E/e' lateral: 8.1  LV SV:         46  LV SV Index:   26  LVOT Area:     3.46 cm     RIGHT VENTRICLE  RV S prime:     19.00 cm/s  TAPSE (M-mode): 2.0 cm   LEFT ATRIUM             Index        RIGHT ATRIUM           Index  LA diam:        3.70 cm 2.10 cm/m   RA Area:     16.10 cm  LA Vol (A2C):   46.0 ml 26.07 ml/m  RA Volume:   37.30 ml  21.14 ml/m  LA Vol (A4C):   57.4 ml 32.53 ml/m  LA Biplane Vol: 52.1 ml 29.53 ml/m   AORTIC VALVE  AV Area (Vmax):    2.37 cm  AV Area (Vmean):   2.29 cm  AV Area (VTI):     2.36 cm  AV Vmax:           94.10  cm/s  AV Vmean:          62.700 cm/s  AV VTI:            0.195 m  AV Peak Grad:      3.5 mmHg  AV Mean Grad:      2.0 mmHg  LVOT Vmax:         64.50 cm/s  LVOT Vmean:        41.400 cm/s  LVOT VTI:          0.133 m  LVOT/AV VTI ratio: 0.68    AORTA  Ao Root diam: 3.30 cm   MITRAL VALVE  MV Area (PHT): 3.83 cm    SHUNTS  MV Decel Time: 198 msec    Systemic VTI:  0.13 m  MV E velocity: 68.70 cm/s  Systemic Diam: 2.10 cm  MV A velocity: 75.50 cm/s  MV E/A ratio:  0.91   Olga Millers MD  Electronically signed by Olga Millers MD  Signature Date/Time: 02/22/2023/3:46:19 PM         Recent Radiology Findings:   CLINICAL DATA:   Shortness of breath   EXAM: CHEST - 2 VIEW   COMPARISON:  X-ray 12/13/2013   FINDINGS: No pneumothorax or effusion. Slight opacity at the left lung base. New from previous. Acute process is possible. Underlying chronic lung changes. No pneumothorax or edema. Normal cardiopericardial silhouette. Calcified aorta. Overlapping cardiac leads. Degenerative changes of the spine. Fixation hardware in the lumbar spine region at the edge of the imaging field.   IMPRESSION: Subtle opacity left lung base new from previous. An acute infiltrate is possible. Recommend follow-up. Underlying chronic changes as well.     Electronically Signed   By: Karen Kays M.D.   On: 02/21/2023 15:05   I have independently reviewed the above radiologic studies and discussed with the patient   Recent Lab Findings: Lab Results  Component Value Date   WBC 8.0 02/25/2023   HGB 12.2 (L) 02/25/2023   HCT 36.4 (L) 02/25/2023   PLT 190 02/25/2023   GLUCOSE 115 (H) 02/25/2023   CHOL 151 02/22/2023   TRIG 92 02/22/2023   HDL 32 (L) 02/22/2023   LDLCALC 101 (H) 02/22/2023   ALT 12 02/21/2023   AST 22 02/21/2023   NA 134 (L) 02/25/2023   K 4.0 02/25/2023   CL 104 02/25/2023   CREATININE 1.49 (H) 02/25/2023   BUN 15 02/25/2023   CO2 23 02/25/2023      Assessment / Plan:      -Severe three-vessel coronary artery disease presenting with acute non-ST elevation myocardial infarction and ischemic cardiomyopathy.  Ejection fraction is estimated at 35 to 40% basal inferior and inferolateral akinesis.  Coronary angiography demonstrates reasonable target anatomy for bypass grafting.  A description of the process of coronary bypass grafting and expected postoperative course were discussed with Mr. Schauer and his wife in detail.  He seems very thoughtful and analytical considering the option of CABG versus multiple pulmonary PCI's.  He cites his advanced age, extensive arthritis, back issues, and the relatively  prolonged recovery as reasons for declining CABG.  He wants to talk with Dr. Cliffton Asters and discuss further before making a decision. He understands that if he elects to proceed with CABG, we will most likely schedule the surgery for early next week.  -Pulmonary infiltrate-present on admission chest x-ray, he was treated with Rocephin and azithromycin initially, now on Augmentin p.o. for 5-day course.  -Stage IIIa CKD-baseline creatinine 1.49.  Monitored post contrast exposure.  -History of hypertension-controlled with low-dose metoprolol.  His telmisartan is on hold.  -Dyslipidemia: History of statin intolerance but so far is tolerating rosuvastatin.   I  spent 25 minutes counseling the patient face to face.   Leary Roca, PA-C  02/25/2023 11:00 AM   Agree with above 86yo male with severe 3V CAD, mildly reduced LVEF, and moderate MR.  He is only symptomatic with strenuous activity.  He also has limited mobility due to joint and back pain.  We discussed the risks and benefits of surgical revascularization, and he is hesitant proceed.  Given his age, mobility, and family support, He likely would be unable to live independently after surgery.  Harrell Keane Scrape

## 2023-02-25 NOTE — Progress Notes (Signed)
   Heart Failure Stewardship Pharmacist Progress Note   PCP: Renford Dills, MD PCP-Cardiologist: None    HPI:  87 yo M with PMH of HTN, BPH, GERD, and depression.   Presented to the ED on 7/5 with chest pain and shortness of breath. He was in his usual state of health until a month ago when he began noticing epigastric/chest pain with exertion.  BNP slightly elevated. ECHO on 7/6 showed LVEF 35-40%, regional wall motion abnormalities, no LVH, G1DD, moderate MR. Cath on 7/8 showed severe multivessel disease, LVEDP 17. TCTS consulted for possible CABG.   Current HF Medications: Beta Blocker: metoprolol XL 12.5 mg daily SGLT2i: Farxiga 10 mg daily  Prior to admission HF Medications: ACE/ARB/ARNI: telmisartan-hydrochlorothiazide 80/25 mg daily *also on amlodipine 2.5 mg daily   Pertinent Lab Values: Serum creatinine 1.49, BUN 15, Potassium 4.0, Sodium 134, BNP 205.8, Magnesium 2.2  Vital Signs: Weight: 150 lbs (admission weight: 152 lbs) Blood pressure: 120/60s  Heart rate: 60s  I/O: net +0.2L yesterday; net +1.7L since admission  Medication Assistance / Insurance Benefits Check: Does the patient have prescription insurance?  Yes Type of insurance plan: Investment banker, corporate  Outpatient Pharmacy:  Prior to admission outpatient pharmacy: [facility] Is the patient willing to use [facility] TOC pharmacy at discharge? Yes Is the patient willing to transition their outpatient pharmacy to utilize a [facility] outpatient pharmacy?   Pending    Assessment: 1. Acute systolic CHF (LVEF 35-40%), due to ICM, pending CABG evaluation. NYHA class II symptoms. - Not volume overloaded on exam, no indications for diuretics. Keep K>4 and Mg>2. - Continue metoprolol XL 12.5 mg daily - Agree with starting Farxiga 10 mg daily - GDMT limited by CKD - Consider discontinuing amlodipine and hydrochlorothiazide on discharge  Plan: 1) Medication changes recommended at this time: - Agree with  changes  2) Patient assistance: - Has federal employee insurance - can use copay cards - Farxiga copay card lowers to $0 per month  3)  Education  - To be completed prior to discharge  Sharen Hones, PharmD, BCPS Heart Failure Engineer, building services Phone [phone]

## 2023-02-25 NOTE — TOC Initial Note (Signed)
Transition of Care [facility]) - Initial/Assessment Note    Patient Details  Name: Calvin Marks MRN: [id number] Date of Birth: [birth year]  Transition of Care [facility]) CM/SW Contact:    Leone Haven, RN Phone Number: 02/25/2023, 4:58 PM  Clinical Narrative:                 From home with wife, indep, he has PCP, Dr. Nehemiah Settle, he has insurance on file.  He currently does not have any HH services in place or DME at home.  His wife will transport him home at dc.  She is his support system.  He gets medications from [facility] on [address],  he states he had cath yesterday and is waiting to speak with surgery.   Expected Discharge Plan: Home/Self Care Barriers to Discharge: Continued Medical Work up   Patient Goals and CMS Choice Patient states their goals for this hospitalization and ongoing recovery are:: return home   Choice offered to / list presented to : NA      Expected Discharge Plan and Services In-house Referral: NA Discharge Planning Services: CM Consult Post Acute Care Choice: NA Living arrangements for the past 2 months: Single Family Home                 DME Arranged: N/A DME Agency: NA       HH Arranged: NA          Prior Living Arrangements/Services Living arrangements for the past 2 months: Single Family Home Lives with:: Spouse Patient language and need for interpreter reviewed:: Yes Do you feel safe going back to the place where you live?: Yes      Need for Family Participation in Patient Care: Yes (Comment) Care giver support system in place?: Yes (comment)   Criminal Activity/Legal Involvement Pertinent to Current Situation/Hospitalization: No - Comment as needed  Activities of Daily Living      Permission Sought/Granted                  Emotional Assessment Appearance:: Appears stated age Attitude/Demeanor/Rapport: Engaged Affect (typically observed): Appropriate Orientation: : Oriented to Self, Oriented to Place, Oriented to   Time, Oriented to Situation Alcohol / Substance Use: Not Applicable Psych Involvement: No (comment)  Admission diagnosis:  NSTEMI (non-ST elevated myocardial infarction) [facility]) [I21.4] Patient Active Problem List   Diagnosis Date Noted   NSTEMI (non-ST elevated myocardial infarction) (HCC) 02/21/2023   Lung infiltrate 02/21/2023   Kidney disease 02/21/2023   Lumbar stenosis with neurogenic claudication 12/23/2013   PCP:  Renford Dills, MD Pharmacy:   [facility] [address], [place] - [address] [place] [address] Phone: [phone] Fax: [phone]  [facility] [address] [place] [address] Phone: [phone] Fax: [phone]     Social Determinants of Health (SDOH) Social History: SDOH Screenings   Tobacco Use: Unknown (02/25/2023)   SDOH Interventions:     Readmission Risk Interventions     No data to display

## 2023-02-26 ENCOUNTER — Inpatient Hospital Stay (HOSPITAL_COMMUNITY): Payer: Medicare Other

## 2023-02-26 DIAGNOSIS — I214 Non-ST elevation (NSTEMI) myocardial infarction: Secondary | ICD-10-CM | POA: Diagnosis not present

## 2023-02-26 DIAGNOSIS — Z01810 Encounter for preprocedural cardiovascular examination: Secondary | ICD-10-CM

## 2023-02-26 LAB — CULTURE, BLOOD (ROUTINE X 2): Culture: NO GROWTH

## 2023-02-26 LAB — CBC
HCT: 36.4 % — ABNORMAL LOW (ref 39.0–52.0)
Hemoglobin: 12 g/dL — ABNORMAL LOW (ref 13.0–17.0)
MCH: 29.7 pg (ref 26.0–34.0)
MCHC: 33 g/dL (ref 30.0–36.0)
MCV: 90.1 fL (ref 80.0–100.0)
Platelets: 216 10*3/uL (ref 150–400)
RBC: 4.04 MIL/uL — ABNORMAL LOW (ref 4.22–5.81)
RDW: 13.4 % (ref 11.5–15.5)
WBC: 7.5 10*3/uL (ref 4.0–10.5)
nRBC: 0 % (ref 0.0–0.2)

## 2023-02-26 LAB — BASIC METABOLIC PANEL
Anion gap: 8 (ref 5–15)
BUN: 16 mg/dL (ref 8–23)
CO2: 24 mmol/L (ref 22–32)
Calcium: 9.4 mg/dL (ref 8.9–10.3)
Chloride: 105 mmol/L (ref 98–111)
Creatinine, Ser: 1.61 mg/dL — ABNORMAL HIGH (ref 0.61–1.24)
GFR, Estimated: 42 mL/min — ABNORMAL LOW (ref >60)
Glucose, Bld: 95 mg/dL (ref 70–99)
Potassium: 4.4 mmol/L (ref 3.5–5.1)
Sodium: 137 mmol/L (ref 135–145)

## 2023-02-26 LAB — VAS US DOPPLER PRE CABG

## 2023-02-26 LAB — HEPARIN LEVEL (UNFRACTIONATED): Heparin Unfractionated: 0.37 IU/mL (ref 0.30–0.70)

## 2023-02-26 MED ORDER — ISOSORBIDE MONONITRATE ER 30 MG PO TB24
15.0000 mg | ORAL_TABLET | Freq: Every day | ORAL | Status: DC
  Filled 2023-02-26: qty 1

## 2023-02-26 MED ORDER — METOPROLOL SUCCINATE ER 25 MG PO TB24
25.0000 mg | ORAL_TABLET | Freq: Every day | ORAL | Status: DC
  Administered 2023-02-27: 25 mg via ORAL
  Filled 2023-02-26: qty 1

## 2023-02-26 MED ORDER — ISOSORBIDE MONONITRATE ER 30 MG PO TB24
30.0000 mg | ORAL_TABLET | Freq: Every day | ORAL | Status: DC
  Administered 2023-02-27: 30 mg via ORAL
  Filled 2023-02-26: qty 1

## 2023-02-26 NOTE — Progress Notes (Signed)
Will revisit pt once TCTS has evaluated and pt is agreeable to plan. Pt favors PCI over OHS. Discussed risk factors and CRP2 with pt.   Faustino Congress MS ACSM-CEP 02/26/2023 9:59 AM

## 2023-02-26 NOTE — Progress Notes (Signed)
PROGRESS NOTE    Calvin Marks  UUV:[id number] DOB: 1936/07/26 DOA: 02/21/2023 PCP: Renford Dills, MD    Brief Narrative:  87 year old with history of hypertension presented to the emergency room with last 3 days of intermittent left lower chest wall pain and likely shortness of breath.  He does have history of hypertension, BPH and GERD as well as depression.  Lives at home.  He is complaining of some symptoms since last 1 month.  In the emergency room hemodynamically stable.  Troponins 415-300 and trending down.  Chest x-ray with subtle opacity left lung base new from previous findings.  Admitted with IV antibiotics, cardiology consulted due to elevated troponins and started on heparin.   Assessment & Plan:   Non-ST elevation MI: Severe triple-vessel coronary artery disease. Currently hemodynamically stable.  Remains on aspirin, IV heparin, statin.  Echocardiogram with reduced ejection fraction 35%.   Patient underwent cardiac catheterization and found to have severe triple-vessel coronary artery disease.  Recommended revascularization with CT surgery. Cardiology following. Patient will likely want medical management and not opt for surgery.  He is waiting to talk to cardiothoracic surgery to get more information.  Started on Imdur 30 mg daily, metoprolol 25 mg daily, already on Crestor.  Acute systolic congestive heart failure: Currently euvolemic.  EF 35%.  Remains on metoprolol, statin.  Farxiga added .  Euvolemic.  Suspected pneumonia left lower lobe: Less likely.  Without evidence of fever or leukocytosis.  Will complete Augmentin for 5 days.  Mobilize.  Incentive spirometry.  Mucolytic's.  Hyperlipidemia: LDL 101.  Allergic to simvastatin.  Started on Crestor.  Will monitor.  Tolerating.  CKD stage IIIb: Kidney ultrasound with evidence of medical renal disease likely chronic.  Likely baseline.  Monitor closely with contrasted studies.  DVT prophylaxis: SCDs Start: 02/21/23  1739   Code Status: Full code Family Communication: None at the bedside Disposition Plan: Status is: Inpatient Remains inpatient appropriate because: Heparin infusion, inpatient procedures planned     Consultants:  Cardiology  Procedures:  None  Antimicrobials:  Rocephin and azithromycin 7/5--- 76 Augmentin 7/6---   Subjective:  Patient seen and examined.  No overnight events.  Occasionally complains of left lower precordial chest pain.  Cough and sputum has improved.  Diarrhea has improved. Appropriately asking questions about different options.  He is worried about recovery if he has to go to bypass surgery.  Objective: Vitals:   02/26/23 0124 02/26/23 0350 02/26/23 0720 02/26/23 1120  BP: 119/69 125/64 133/77 139/74  Pulse: 65   63  Resp: 18 18 18 17   Temp: 98.5 F (36.9 C) 97.8 F (36.6 C) 97.7 F (36.5 C) 98.1 F (36.7 C)  TempSrc: Oral Oral Oral Oral  SpO2: 96% 98% 97% 98%  Weight:  67.7 kg    Height:        Intake/Output Summary (Last 24 hours) at 02/26/2023 1141 Last data filed at 02/26/2023 1122 Gross per 24 hour  Intake 555.75 ml  Output 1000 ml  Net -444.25 ml    Filed Weights   02/24/23 0637 02/25/23 0451 02/26/23 0350  Weight: 68.9 kg 68.3 kg 67.7 kg    Examination:  General exam: Appears calm and comfortable.  Walking around in the room.  Patient Respiratory system: No added sounds.   Cardiovascular system: S1 & S2 heard, RRR.  Gastrointestinal system: Soft.  Nontender. Central nervous system: Alert and oriented. No focal neurological deficits. Extremities: Symmetric 5 x 5 power.    Data Reviewed: I have  personally reviewed following labs and imaging studies  CBC: Recent Labs  Lab 02/21/23 1428 02/23/23 0034 02/24/23 0044 02/25/23 0047 02/26/23 0138  WBC 7.3 5.6 6.3 8.0 7.5  HGB 13.9 11.5* 11.1* 12.2* 12.0*  HCT 41.9 34.7* 34.1* 36.4* 36.4*  MCV 91.5 92.0 90.9 91.5 90.1  PLT 259 202 196 190 216    Basic Metabolic  Panel: Recent Labs  Lab 02/22/23 0105 02/23/23 1006 02/24/23 0044 02/25/23 0047 02/26/23 0138  NA 134* 136 134* 134* 137  K 4.1 4.1 3.9 4.0 4.4  CL 104 103 102 104 105  CO2 25 24 24 23 24   GLUCOSE 105* 132* 108* 115* 95  BUN 23 17 19 15 16   CREATININE 1.65* 1.41* 1.67* 1.49* 1.61*  CALCIUM 8.6* 9.1 8.6* 8.9 9.4  MG 2.2  --   --   --   --     GFR: Estimated Creatinine Clearance: 29.7 mL/min (A) (by C-G formula based on SCr of 1.61 mg/dL (H)). Liver Function Tests: Recent Labs  Lab 02/21/23 1428  AST 22  ALT 12  ALKPHOS 79  BILITOT 0.6  PROT 8.0  ALBUMIN 4.4    No results for input(s): "LIPASE", "AMYLASE" in the last 168 hours. No results for input(s): "AMMONIA" in the last 168 hours. Coagulation Profile: No results for input(s): "INR", "PROTIME" in the last 168 hours. Cardiac Enzymes: No results for input(s): "CKTOTAL", "CKMB", "CKMBINDEX", "TROPONINI" in the last 168 hours. BNP (last 3 results) No results for input(s): "PROBNP" in the last 8760 hours. HbA1C: No results for input(s): "HGBA1C" in the last 72 hours. CBG: No results for input(s): "GLUCAP" in the last 168 hours. Lipid Profile: No results for input(s): "CHOL", "HDL", "LDLCALC", "TRIG", "CHOLHDL", "LDLDIRECT" in the last 72 hours.  Thyroid Function Tests: No results for input(s): "TSH", "T4TOTAL", "FREET4", "T3FREE", "THYROIDAB" in the last 72 hours. Anemia Panel: No results for input(s): "VITAMINB12", "FOLATE", "FERRITIN", "TIBC", "IRON", "RETICCTPCT" in the last 72 hours. Sepsis Labs: No results for input(s): "PROCALCITON", "LATICACIDVEN" in the last 168 hours.  Recent Results (from the past 240 hour(s))  SARS Coronavirus 2 by RT PCR (hospital order, performed in [facility] hospital lab) *cepheid single result test* Nasopharyngeal Swab     Status: None   Collection Time: 02/21/23  6:19 PM   Specimen: Nasopharyngeal Swab; Nasal Swab  Result Value Ref Range Status   SARS Coronavirus 2 by RT PCR  NEGATIVE NEGATIVE Final    Comment: Performed at [facility], [address], [place], [place] [address]  Respiratory (~20 pathogens) panel by PCR     Status: None   Collection Time: 02/21/23  6:19 PM   Specimen: Nasopharyngeal Swab; Respiratory  Result Value Ref Range Status   Adenovirus NOT DETECTED NOT DETECTED Final   Coronavirus 229E NOT DETECTED NOT DETECTED Final    Comment: (NOTE) The Coronavirus on the Respiratory Panel, DOES NOT test for the novel  Coronavirus (2019 nCoV)    Coronavirus HKU1 NOT DETECTED NOT DETECTED Final   Coronavirus NL63 NOT DETECTED NOT DETECTED Final   Coronavirus OC43 NOT DETECTED NOT DETECTED Final   Metapneumovirus NOT DETECTED NOT DETECTED Final   Rhinovirus / Enterovirus NOT DETECTED NOT DETECTED Final   Influenza A NOT DETECTED NOT DETECTED Final   Influenza B NOT DETECTED NOT DETECTED Final   Parainfluenza Virus 1 NOT DETECTED NOT DETECTED Final   Parainfluenza Virus 2 NOT DETECTED NOT DETECTED Final   Parainfluenza Virus 3 NOT DETECTED NOT DETECTED Final  Parainfluenza Virus 4 NOT DETECTED NOT DETECTED Final   Respiratory Syncytial Virus NOT DETECTED NOT DETECTED Final   Bordetella pertussis NOT DETECTED NOT DETECTED Final   Bordetella Parapertussis NOT DETECTED NOT DETECTED Final   Chlamydophila pneumoniae NOT DETECTED NOT DETECTED Final   Mycoplasma pneumoniae NOT DETECTED NOT DETECTED Final    Comment: Performed at [facility], [address], [place], [place] [address]  Culture, blood (Routine X 2) w Reflex to ID Panel     Status: None   Collection Time: 02/21/23  6:49 PM   Specimen: BLOOD RIGHT ARM  Result Value Ref Range Status   Specimen Description BLOOD RIGHT ARM  Final   Special Requests   Final    BOTTLES DRAWN AEROBIC ONLY Blood Culture results may not be optimal due to an inadequate volume of blood received in culture bottles   Culture   Final    NO GROWTH 5 DAYS Performed at [facility], [address], [place], [place] [address]    Report Status 02/26/2023 FINAL  Final         Radiology Studies: CARDIAC CATHETERIZATION  Result Date: 02/24/2023   Prox RCA lesion is 70% stenosed.   RPDA lesion is 100% stenosed.   Ost LM lesion is 40% stenosed.   Ost LAD to Prox LAD lesion is 90% stenosed.   Mid LAD lesion is 95% stenosed.   1st Diag-1 lesion is 95% stenosed.   1st Diag-2 lesion is 90% stenosed.   Ost Cx to Prox Cx lesion is 80% stenosed.   Prox Cx to Mid Cx lesion is 95% stenosed. 1.  Severe multivessel disease. 2.  LVEDP of 17 mmHg. Recommendation: High complexity disease best served by surgical revascularization.        Scheduled Meds:  amoxicillin-clavulanate  1 tablet Oral Q12H   aspirin  81 mg Oral Daily   dapagliflozin propanediol  10 mg Oral Daily   finasteride  5 mg Oral q morning   HYDROcodone-acetaminophen  1 tablet Oral QID   [START ON 02/27/2023] isosorbide mononitrate  30 mg Oral Daily   magnesium oxide  400 mg Oral QHS   melatonin  10 mg Oral QHS   [START ON 02/27/2023] metoprolol succinate  25 mg Oral Daily   omega-3 acid ethyl esters  1 g Oral Daily   pantoprazole  20 mg Oral Daily   rosuvastatin  20 mg Oral Daily   sodium chloride flush  3 mL Intravenous Q12H   sodium chloride flush  3 mL Intravenous Q12H   traZODone  100 mg Oral QHS   Continuous Infusions:  sodium chloride     heparin 1,050 Units/hr (02/26/23 0303)     LOS: 5 days    Time spent: 35 minutes    Dorcas Carrow, MD [facility]

## 2023-02-26 NOTE — Progress Notes (Addendum)
Patient Name: Calvin Marks Date of Encounter: 02/26/2023 [facility] Cardiologist: Donato Schultz, MD   Interval Summary  .    87 y.o. male with a hx of HTN, BPH, GERD, depression, cardiology is following for NSTEMI and newly discovered LV systolic dysfunction and significant 3 vessel CAD.  Dx with PNA and received ABX.   TCTS has seen awaiting Dr. Cliffton Asters plan.  Today has some mild chest pain and occ SOB - but he did not feel worrisome.   Vital Signs .    Vitals:   02/25/23 1958 02/26/23 0124 02/26/23 0350 02/26/23 0720  BP: 127/71 119/69 125/64 133/77  Pulse: 60 65    Resp: 18 18 18 18   Temp: 98.5 F (36.9 C) 98.5 F (36.9 C) 97.8 F (36.6 C) 97.7 F (36.5 C)  TempSrc: Oral Oral Oral Oral  SpO2: 97% 96% 98% 97%  Weight:   67.7 kg   Height:        Intake/Output Summary (Last 24 hours) at 02/26/2023 0856 Last data filed at 02/26/2023 0353 Gross per 24 hour  Intake 555.75 ml  Output 600 ml  Net -44.25 ml      02/26/2023    3:50 AM 02/25/2023    4:51 AM 02/24/2023    6:37 AM  Last 3 Weights  Weight (lbs) 149 lb 3.2 oz 150 lb 9.2 oz 151 lb 14.4 oz  Weight (kg) 67.677 kg 68.3 kg 68.9 kg      Telemetry/ECG    SR to ST at 120  - Personally Reviewed  ECHO 02/22/23  EF 35-40% G1DD  Mod MR   Physical Exam .   GEN: No acute distress.   Neck: No JVD Cardiac: RRR, no murmurs, rubs, or gallops.  Respiratory: Clear to auscultation bilaterally. GI: Soft, nontender, non-distended  MS: No edema  Assessment & Plan .     NSTEMI-  cath with 3 vessel disease,  TCTS in process of eval.   --on IV heparin  --on ASA and toprol 12.5 daily  --may need to add imdur low dose    Acute systolic HF/ICM  --EF 35-40% -- +1676  wt down from 69.3 kg to 67.7 Kg --no longer on lasix --+ farxiga did receive today-  if for surgery will need to hold --begin Entresto after surgery  HTN --stable  133/77  CKD --Cr 1.61  (was 1.62 on admit)  --monitor   HLD-- on Crestor  20 --LDL on admit 101 HDL 32 and Tchol 151  TG 92  LPa 12.1  Possible PNA --treated with ABX  per IM    For questions or updates, please contact [facility] Please consult [email or web address] for contact info under      Signed, Nada Boozer, NP   History and all data above reviewed.  Patient examined.  I agree with the findings as above.   No chest pain.  No acute SOB. The patient exam reveals COR:RRR  ,  Lungs: Decreased breath sounds with basilar crackles  ,  Abd: Positive bowel sounds, no rebound no guarding, Ext No edema  .  All available labs, radiology testing, previous records reviewed. Agree with documented assessment and plan. NSTEMI:  I had a long conversation with the patient.  I reviewed his films again and discussed with Dr. Geralynn Rile.  Given the extent of the disease, PCI is not an option.  The patient understand the decision is medical management vs CABG and he will wait to talk to Dr. Cliffton Asters  but is leaning toward medical management. If the decision is not to have surgery we will start Plavix.   Today increase beta blocker and Imdur.    Fayrene Fearing [facility]  10:48 AM  02/26/2023

## 2023-02-26 NOTE — Progress Notes (Signed)
   Heart Failure Stewardship Pharmacist Progress Note   PCP: Renford Dills, MD PCP-Cardiologist: None    HPI:  87 yo M with PMH of HTN, BPH, GERD, and depression.   Presented to the ED on 7/5 with chest pain and shortness of breath. He was in his usual state of health until a month ago when he began noticing epigastric/chest pain with exertion.  BNP slightly elevated. ECHO on 7/6 showed LVEF 35-40%, regional wall motion abnormalities, no LVH, G1DD, moderate MR. Cath on 7/8 showed severe multivessel disease, LVEDP 17. TCTS consulted for possible CABG vs PCI vs medical management.   Current HF Medications: Beta Blocker: metoprolol XL 25 mg daily SGLT2i: Farxiga 10 mg daily  Prior to admission HF Medications: ACE/ARB/ARNI: telmisartan-hydrochlorothiazide 80/25 mg daily *also on amlodipine 2.5 mg daily   Pertinent Lab Values: Serum creatinine 1.61, BUN 16, Potassium 4.4, Sodium 137, BNP 205.8, Magnesium 2.2  Vital Signs: Weight: 149 lbs (admission weight: 152 lbs) Blood pressure: 130/70s  Heart rate: 60s  I/O: net +0.2L yesterday; net +1.7L since admission  Medication Assistance / Insurance Benefits Check: Does the patient have prescription insurance?  Yes Type of insurance plan: Investment banker, corporate  Outpatient Pharmacy:  Prior to admission outpatient pharmacy: [facility] Is the patient willing to use [facility] TOC pharmacy at discharge? Yes Is the patient willing to transition their outpatient pharmacy to utilize a [facility] outpatient pharmacy?   Pending    Assessment: 1. Acute systolic CHF (LVEF 35-40%), due to ICM, pending CABG evaluation. NYHA class II symptoms. - Not volume overloaded on exam, no indications for diuretics. Keep K>4 and Mg>2. - Metoprolol XL increased to 25 mg daily - Continue Farxiga 10 mg daily - Further GDMT pending final plans for surgery vs PCI vs medical management - Consider discontinuing amlodipine and hydrochlorothiazide on  discharge  Plan: 1) Medication changes recommended at this time: - Agree with changes  2) Patient assistance: - Has federal employee insurance - can use copay cards - Farxiga copay card lowers to $0 per month  3)  Education  - To be completed prior to discharge  Sharen Hones, PharmD, BCPS Heart Failure Engineer, building services Phone [phone]

## 2023-02-26 NOTE — Progress Notes (Signed)
Pre-CABG vascular studies completed.   Please see CV Procedures for preliminary results.  Kendalynn Wideman, RVT  12:48 PM 02/26/23

## 2023-02-26 NOTE — Progress Notes (Signed)
ANTICOAGULATION CONSULT NOTE - Follow up Consult  Pharmacy Consult for heparin Indication: multivessel CAD  Allergies  Allergen Reactions   Lisinopril Other (See Comments)    Caused trouble urinating and soreness in the lower abdomen   Pravastatin Other (See Comments)    Leg cramps   Simvastatin Other (See Comments)    Welts, fever, headaches   Ciprofloxacin Hives and Rash    Patient Measurements: Height: 5\' 5"  (165.1 cm) Weight: 67.7 kg (149 lb 3.2 oz) IBW/kg (Calculated) : 61.5 Heparin Dosing Weight: 73.6 kg  Vital Signs: Temp: 98.1 F (36.7 C) (07/10 1120) Temp Source: Oral (07/10 1120) BP: 139/74 (07/10 1120) Pulse Rate: 63 (07/10 1120)  Labs: Recent Labs    02/24/23 0044 02/24/23 1234 02/25/23 0047 02/25/23 0742 02/26/23 0138  HGB 11.1*  --  12.2*  --  12.0*  HCT 34.1*  --  36.4*  --  36.4*  PLT 196  --  190  --  216  HEPARINUNFRC  --    < > 0.21* 0.36 0.37  CREATININE 1.67*  --  1.49*  --  1.61*   < > = values in this interval not displayed.     Estimated Creatinine Clearance: 29.7 mL/min (A) (by C-G formula based on SCr of 1.61 mg/dL (H)).   Medical History: Past Medical History:  Diagnosis Date   Arthritis    Constipation    Depression    Enlarged prostate    Environmental allergies    GERD (gastroesophageal reflux disease)    Hypertension      Assessment: 87 year old male presented with chest pain and shortness of breath. Chest pain associated with exertion, goes away with rest. Cardiology consulted - recommend starting on heparin. No anticoagulation noted PTA. Pharmacy consulted to manage heparin infusion.  S/p cath which showed severe multivessel disease. TCTS consulted.   Heparin restarted  post cath 02/24/23 2 hours post TR band removal.  7/10  heparin level  is 0.37, remains therapeutic on heparin infusion at 1050 units/hour.  CBC stable with Hgb 11s-12s, pltc wnl. No signs/symptoms of bleeding reported.  TCTS has seen patient, awaiting  Dr. Cliffton Asters plan.  Goal of Therapy:  Heparin level 0.3-0.7 units/ml Monitor platelets by anticoagulation protocol: Yes   Plan:  Continue heparin infusion at 1050 units/h Heparin level and CBC daily Monitor for signs and symptoms of bleeding TCTS has seen patient, awaiting Dr. Cliffton Asters plan.   Thank you for allowing pharmacy to be part of this patients care team. Noah Delaine, RPh Clinical Pharmacist  02/26/2023 11:24 AM

## 2023-02-27 ENCOUNTER — Telehealth (HOSPITAL_COMMUNITY): Payer: Self-pay | Admitting: Pharmacy Technician

## 2023-02-27 ENCOUNTER — Other Ambulatory Visit (HOSPITAL_COMMUNITY): Payer: Self-pay

## 2023-02-27 DIAGNOSIS — I214 Non-ST elevation (NSTEMI) myocardial infarction: Secondary | ICD-10-CM | POA: Diagnosis not present

## 2023-02-27 LAB — CBC
HCT: 36.9 % — ABNORMAL LOW (ref 39.0–52.0)
Hemoglobin: 12.9 g/dL — ABNORMAL LOW (ref 13.0–17.0)
MCH: 30.3 pg (ref 26.0–34.0)
MCHC: 35 g/dL (ref 30.0–36.0)
MCV: 86.6 fL (ref 80.0–100.0)
Platelets: 170 10*3/uL (ref 150–400)
RBC: 4.26 MIL/uL (ref 4.22–5.81)
RDW: 13.5 % (ref 11.5–15.5)
WBC: 7.5 10*3/uL (ref 4.0–10.5)
nRBC: 0 % (ref 0.0–0.2)

## 2023-02-27 LAB — BASIC METABOLIC PANEL
Anion gap: 13 (ref 5–15)
BUN: 19 mg/dL (ref 8–23)
CO2: 19 mmol/L — ABNORMAL LOW (ref 22–32)
Calcium: 9 mg/dL (ref 8.9–10.3)
Chloride: 103 mmol/L (ref 98–111)
Creatinine, Ser: 1.51 mg/dL — ABNORMAL HIGH (ref 0.61–1.24)
GFR, Estimated: 45 mL/min — ABNORMAL LOW (ref >60)
Glucose, Bld: 85 mg/dL (ref 70–99)
Potassium: 4.2 mmol/L (ref 3.5–5.1)
Sodium: 135 mmol/L (ref 135–145)

## 2023-02-27 LAB — HEPARIN LEVEL (UNFRACTIONATED): Heparin Unfractionated: 0.23 IU/mL — ABNORMAL LOW (ref 0.30–0.70)

## 2023-02-27 MED ORDER — METOPROLOL SUCCINATE ER 25 MG PO TB24
25.0000 mg | ORAL_TABLET | Freq: Every day | ORAL | 0 refills | Status: DC
  Filled 2023-02-27: qty 30, 30d supply, fill #0

## 2023-02-27 MED ORDER — DAPAGLIFLOZIN PROPANEDIOL 10 MG PO TABS
10.0000 mg | ORAL_TABLET | Freq: Every day | ORAL | 0 refills | Status: DC
Start: 2023-02-28 — End: 2023-03-06
  Filled 2023-02-27: qty 30, 30d supply, fill #0

## 2023-02-27 MED ORDER — ASPIRIN 81 MG PO CHEW
81.0000 mg | CHEWABLE_TABLET | Freq: Every day | ORAL | 3 refills | Status: AC
  Filled 2023-02-27 – 2023-03-26 (×2): qty 90, 90d supply, fill #0

## 2023-02-27 MED ORDER — CLOPIDOGREL BISULFATE 75 MG PO TABS
75.0000 mg | ORAL_TABLET | Freq: Every day | ORAL | 11 refills | Status: DC
  Filled 2023-02-27: qty 30, 30d supply, fill #0
  Filled 2023-03-25: qty 30, 30d supply, fill #1
  Filled 2023-03-26: qty 30, 30d supply, fill #0

## 2023-02-27 MED ORDER — ISOSORBIDE MONONITRATE ER 30 MG PO TB24
30.0000 mg | ORAL_TABLET | Freq: Every day | ORAL | 0 refills | Status: DC
  Filled 2023-02-27: qty 30, 30d supply, fill #0

## 2023-02-27 MED ORDER — PANTOPRAZOLE SODIUM 20 MG PO TBEC
20.0000 mg | DELAYED_RELEASE_TABLET | Freq: Every day | ORAL | 0 refills | Status: DC
Start: 2023-02-28 — End: 2023-06-20
  Filled 2023-02-27: qty 30, 30d supply, fill #0

## 2023-02-27 MED ORDER — ROSUVASTATIN CALCIUM 20 MG PO TABS
20.0000 mg | ORAL_TABLET | Freq: Every day | ORAL | 0 refills | Status: DC
  Filled 2023-02-27: qty 30, 30d supply, fill #0

## 2023-02-27 NOTE — Progress Notes (Signed)
Progress Note  Patient Name: Calvin Marks Date of Encounter: 02/27/2023  Primary Cardiologist:   Donato Schultz, MD   Subjective   No chest pain.  Ambulated in the room.  No acute SOB.   Inpatient Medications    Scheduled Meds:  aspirin  81 mg Oral Daily   dapagliflozin propanediol  10 mg Oral Daily   finasteride  5 mg Oral q morning   HYDROcodone-acetaminophen  1 tablet Oral QID   isosorbide mononitrate  30 mg Oral Daily   magnesium oxide  400 mg Oral QHS   melatonin  10 mg Oral QHS   metoprolol succinate  25 mg Oral Daily   omega-3 acid ethyl esters  1 g Oral Daily   pantoprazole  20 mg Oral Daily   rosuvastatin  20 mg Oral Daily   sodium chloride flush  3 mL Intravenous Q12H   sodium chloride flush  3 mL Intravenous Q12H   traZODone  100 mg Oral QHS   Continuous Infusions:  sodium chloride     PRN Meds: sodium chloride, acetaminophen **OR** acetaminophen, acetaminophen, guaiFENesin-dextromethorphan, labetalol, ondansetron (ZOFRAN) IV, sodium chloride flush   Vital Signs    Vitals:   02/26/23 2011 02/27/23 0042 02/27/23 0412 02/27/23 0750  BP: 113/88 119/66 114/63 121/75  Pulse: 62 66  61  Resp: 18 18 18 18   Temp: 98.2 F (36.8 C)   98.4 F (36.9 C)  TempSrc: Oral  Oral Oral  SpO2: 97% 97% 98% 97%  Weight:   67.4 kg   Height:        Intake/Output Summary (Last 24 hours) at 02/27/2023 1148 Last data filed at 02/27/2023 0414 Gross per 24 hour  Intake 819.16 ml  Output 300 ml  Net 519.16 ml   Filed Weights   02/25/23 0451 02/26/23 0350 02/27/23 0412  Weight: 68.3 kg 67.7 kg 67.4 kg    Telemetry    RRR, PVCs - Personally Reviewed  ECG    NA - Personally Reviewed  Physical Exam   GEN: No acute distress.   Neck: No  JVD Cardiac: RRR, no murmurs, rubs, or gallops.  Respiratory: Clear  to auscultation bilaterally. GI: Soft, nontender, non-distended  MS: No  edema; No deformity. Neuro:  Nonfocal  Psych: Normal affect   Labs     Chemistry Recent Labs  Lab 02/21/23 1428 02/22/23 0105 02/25/23 0047 02/26/23 0138 02/27/23 0118  NA 138   < > 134* 137 135  K 3.8   < > 4.0 4.4 4.2  CL 105   < > 104 105 103  CO2 23   < > 23 24 19*  GLUCOSE 100*   < > 115* 95 85  BUN 26*   < > 15 16 19   CREATININE 1.62*   < > 1.49* 1.61* 1.51*  CALCIUM 9.5   < > 8.9 9.4 9.0  PROT 8.0  --   --   --   --   ALBUMIN 4.4  --   --   --   --   AST 22  --   --   --   --   ALT 12  --   --   --   --   ALKPHOS 79  --   --   --   --   BILITOT 0.6  --   --   --   --   GFRNONAA 42*   < > 46* 42* 45*  ANIONGAP 10   < >  7 8 13    < > = values in this interval not displayed.     Hematology Recent Labs  Lab 02/25/23 0047 02/26/23 0138 02/27/23 0118  WBC 8.0 7.5 7.5  RBC 3.98* 4.04* 4.26  HGB 12.2* 12.0* 12.9*  HCT 36.4* 36.4* 36.9*  MCV 91.5 90.1 86.6  MCH 30.7 29.7 30.3  MCHC 33.5 33.0 35.0  RDW 13.4 13.4 13.5  PLT 190 216 170    Cardiac EnzymesNo results for input(s): "TROPONINI" in the last 168 hours. No results for input(s): "TROPIPOC" in the last 168 hours.   BNP Recent Labs  Lab 02/21/23 1428  BNP 205.8*     DDimer  Recent Labs  Lab 02/21/23 1849  DDIMER 0.46     Radiology    VAS US DOPPLER PRE CABG  Result Date: 02/26/2023 PREOPERATIVE VASCULAR EVALUATION Patient Name:  GILDO CRISCO  Date of Exam:   02/26/2023 Medical Rec #: [id number]          Accession #:    [id number] Date of Birth: 1936/07/10          Patient Gender: M Patient Age:   58 years Exam Location:  [facility] Procedure:      VAS US DOPPLER PRE CABG Referring Phys: HARRELL LIGHTFOOT --------------------------------------------------------------------------------  Indications:      Pre-CABG. Risk Factors:     Hypertension, hyperlipidemia, coronary artery disease. Comparison Study: No previous study. Performing Technologist: McKayla Maag RVT, [place]  Examination Guidelines: A complete evaluation includes B-mode imaging, spectral Doppler, color  Doppler, and power Doppler as needed of all accessible portions of each vessel. Bilateral testing is considered an integral part of a complete examination. Limited examinations for reoccurring indications may be performed as noted.  Right Carotid Findings: +----------+-------+-------+--------+------------------------+-----------------+           PSV    EDV    StenosisDescribe                Comments                    cm/s   cm/s                                                     +----------+-------+-------+--------+------------------------+-----------------+ CCA Prox  73     9                                      intimal                                                                   thickening        +----------+-------+-------+--------+------------------------+-----------------+ CCA Distal72     12                                     intimal  thickening        +----------+-------+-------+--------+------------------------+-----------------+ ICA Prox  75     15     1-39%   irregular and                                                             heterogenous                              +----------+-------+-------+--------+------------------------+-----------------+ ICA Distal88     26                                                       +----------+-------+-------+--------+------------------------+-----------------+ ECA       265    14     >50%    irregular and                                                             homogeneous                               +----------+-------+-------+--------+------------------------+-----------------+ +----------+--------+-------+----------------+------------+           PSV cm/sEDV cmsDescribe        Arm Pressure +----------+--------+-------+----------------+------------+ [id number]            Multiphasic, WNL              +----------+--------+-------+----------------+------------+ +---------+--------+--+--------+--+---------+ VertebralPSV cm/s51EDV cm/s13Antegrade +---------+--------+--+--------+--+---------+ Left Carotid Findings: +----------+-------+--------+--------+-----------------------+-----------------+           PSV    EDV cm/sStenosisDescribe               Comments                    cm/s                                                            +----------+-------+--------+--------+-----------------------+-----------------+ CCA Prox  101    13                                     intimal                                                                   thickening        +----------+-------+--------+--------+-----------------------+-----------------+ CCA Distal85     14  intimal                                                                   thickening        +----------+-------+--------+--------+-----------------------+-----------------+ ICA Prox  107    21      1-39%   irregular and                                                             homogeneous                              +----------+-------+--------+--------+-----------------------+-----------------+ ICA Distal79     23                                                       +----------+-------+--------+--------+-----------------------+-----------------+ ECA       137    17                                                       +----------+-------+--------+--------+-----------------------+-----------------+  +----------+--------+--------+----------------+------------+ SubclavianPSV cm/sEDV cm/sDescribe        Arm Pressure +----------+--------+--------+----------------+------------+           151             Multiphasic, WNL             +----------+--------+--------+----------------+------------+  +---------+--------+--+--------+-+---------+ VertebralPSV cm/s16EDV cm/s7Antegrade +---------+--------+--+--------+-+---------+  ABI Findings: +---------+------------------+-----+---------+--------+ Right    Rt Pressure (mmHg)IndexWaveform Comment  +---------+------------------+-----+---------+--------+ Brachial 156                    triphasic         +---------+------------------+-----+---------+--------+ PTA      161               1.03 triphasic         +---------+------------------+-----+---------+--------+ DP       121               0.78 biphasic          +---------+------------------+-----+---------+--------+ Cletis Media               0.72 Normal            +---------+------------------+-----+---------+--------+ +---------+------------------+-----+---------+---------------------------------+ Left     Lt Pressure (mmHg)IndexWaveform Comment                           +---------+------------------+-----+---------+---------------------------------+ Brachial                        triphasicNo pressure obtained due to IV  placement                         +---------+------------------+-----+---------+---------------------------------+ PTA      155               0.99 triphasic                                  +---------+------------------+-----+---------+---------------------------------+ DP       140               0.90 triphasic                                  +---------+------------------+-----+---------+---------------------------------+ Great Toe106               0.68 Normal                                     +---------+------------------+-----+---------+---------------------------------+ +-------+---------------+----------------+ ABI/TBIToday's ABI/TBIPrevious ABI/TBI +-------+---------------+----------------+ Right  1.03/ 0.72                       +-------+---------------+----------------+ Left   0.99/ 0.68                      +-------+---------------+----------------+  Right Doppler Findings: +--------+--------+-----+---------+--------+ Site    PressureIndexDoppler  Comments +--------+--------+-----+---------+--------+ [id number]          triphasic         +--------+--------+-----+---------+--------+ Radial               triphasic         +--------+--------+-----+---------+--------+ Ulnar                biphasic          +--------+--------+-----+---------+--------+  Left Doppler Findings: +--------+--------+-----+---------+----------------------------------------+ Site    PressureIndexDoppler  Comments                                 +--------+--------+-----+---------+----------------------------------------+ Brachial             triphasicNo pressure obtained due to IV placement +--------+--------+-----+---------+----------------------------------------+ Radial               triphasic                                         +--------+--------+-----+---------+----------------------------------------+ Ulnar                triphasic                                         +--------+--------+-----+---------+----------------------------------------+   Summary: Right Carotid: Velocities in the right ICA are consistent with a 1-39% stenosis.                The ECA appears >50% stenosed. Left Carotid: Velocities in the left ICA are consistent with a 1-39% stenosis. Vertebrals:  Bilateral vertebral arteries demonstrate antegrade flow. Subclavians: Normal flow hemodynamics were seen in bilateral subclavian  arteries. Right ABI: Resting right ankle-brachial index is within normal range. The right toe-brachial index is normal. Left ABI: Resting left ankle-brachial index is within normal range. The left toe-brachial index is normal. Right Upper Extremity: Doppler waveforms remain within normal limits with  right radial compression. Doppler waveforms decrease <50% with right ulnar compression. Left Upper Extremity: Doppler waveform obliterate with left radial compression. Doppler waveforms decrease 50% with left ulnar compression.  Electronically signed by Heath Lark on 02/26/2023 at 5:40:11 PM.    Final     Cardiac Studies   See above.   Patient Profile     87 y.o. male with history of hypertension presented to the emergency room with last 3 days of intermittent left lower chest wall pain and likely shortness of breath. He does have history of hypertension, BPH and GERD as well as depression. Lives at home. He is complaining of some symptoms since last 1 month. In the emergency room hemodynamically stable. Troponins 415-300 and trending down. Chest x-ray with subtle opacity left lung base new from previous findings. Admitted with IV antibiotics, cardiology consulted due to elevated troponins and started on heparin.   Assessment & Plan    NSTEMI:  Patient opted for medical management rather than high risk CABG.  Will add Plavix.  Heparin discontinued.  Acute systolic HF/ICM :  Newly diagnosied.  Beta blocker started and Imdur increased today.  I will not add Entresto today but will titrate as an out patient.    HTN:   This is being managed in the context of treating his CHF   CKD:  Creat is stable.  Follow on med titration.   HLD:  Started on Crestor.     For questions or updates, please contact [facility] Please consult [email or web address] for contact info under Cardiology/STEMI.   Signed, Rollene Rotunda, MD  02/27/2023, 11:48 AM

## 2023-02-27 NOTE — Plan of Care (Signed)
Patient ID: BELL CAI, male   DOB: July 07, 1936, 87 y.o.   MRN: [id number]  Problem: Education: Goal: Knowledge of General Education information will improve Description: Including pain rating scale, medication(s)/side effects and non-pharmacologic comfort measures Outcome: Adequate for Discharge   Problem: Health Behavior/Discharge Planning: Goal: Ability to manage health-related needs will improve Outcome: Adequate for Discharge   Problem: Clinical Measurements: Goal: Ability to maintain clinical measurements within normal limits will improve Outcome: Adequate for Discharge Goal: Will remain free from infection Outcome: Adequate for Discharge Goal: Diagnostic test results will improve Outcome: Adequate for Discharge Goal: Respiratory complications will improve Outcome: Adequate for Discharge Goal: Cardiovascular complication will be avoided Outcome: Adequate for Discharge   Problem: Activity: Goal: Risk for activity intolerance will decrease Outcome: Adequate for Discharge   Problem: Nutrition: Goal: Adequate nutrition will be maintained Outcome: Adequate for Discharge   Problem: Coping: Goal: Level of anxiety will decrease Outcome: Adequate for Discharge   Problem: Elimination: Goal: Will not experience complications related to bowel motility Outcome: Adequate for Discharge Goal: Will not experience complications related to urinary retention Outcome: Adequate for Discharge   Problem: Pain Managment: Goal: General experience of comfort will improve Outcome: Adequate for Discharge   Problem: Safety: Goal: Ability to remain free from injury will improve Outcome: Adequate for Discharge   Problem: Skin Integrity: Goal: Risk for impaired skin integrity will decrease Outcome: Adequate for Discharge   Problem: Education: Goal: Understanding of CV disease, CV risk reduction, and recovery process will improve Outcome: Adequate for Discharge Goal: Individualized  Educational Video(s) Outcome: Adequate for Discharge   Problem: Activity: Goal: Ability to return to baseline activity level will improve Outcome: Adequate for Discharge   Problem: Cardiovascular: Goal: Ability to achieve and maintain adequate cardiovascular perfusion will improve Outcome: Adequate for Discharge Goal: Vascular access site(s) Level 0-1 will be maintained Outcome: Adequate for Discharge   Problem: Health Behavior/Discharge Planning: Goal: Ability to safely manage health-related needs after discharge will improve Outcome: Adequate for Discharge    Lidia Collum, RN

## 2023-02-27 NOTE — Progress Notes (Signed)
CARDIAC REHAB PHASE I   Pt educated on MI booklet, exercise guidelines, restrictions, nutrition, and angina. Referral placed for CRP2, however pt in N/A for CRP2 due to severe CAD. Exercise at home encouraged. All questions answered, pt left in recliner with call bell in reach.  [phone]   Jonna Coup, MS, ACSM-CEP 02/27/2023 2:52 PM

## 2023-02-27 NOTE — Progress Notes (Signed)
PROGRESS NOTE    Calvin Marks  ZOX:[id number] DOB: Apr 08, 1936 DOA: 02/21/2023 PCP: Renford Dills, MD    Brief Narrative:  87 year old with history of hypertension presented to the emergency room with last 3 days of intermittent left lower chest wall pain and likely shortness of breath.  He does have history of hypertension, BPH and GERD as well as depression.  Lives at home.  He is complaining of some symptoms since last 1 month.  In the emergency room hemodynamically stable.  Troponins 415-300 and trending down.  Chest x-ray with subtle opacity left lung base new from previous findings.  Admitted with IV antibiotics, cardiology consulted due to elevated troponins and started on heparin.   Assessment & Plan:   Non-ST elevation MI: Severe triple-vessel coronary artery disease. Currently hemodynamically stable.  Remains on aspirin, IV heparin, statin.  Echocardiogram with reduced ejection fraction 35%.   Patient underwent cardiac catheterization and found to have severe triple-vessel coronary artery disease.  Recommended revascularization with CT surgery. Cardiology following. Patient decided against CABG. Currently on Imdur, metoprolol. Cardiology following, likely home on medication therapy.   Acute systolic congestive heart failure: Currently euvolemic.  EF 35%.  Remains on metoprolol, statin.  Farxiga added .  Euvolemic.  Suspected pneumonia left lower lobe: Less likely.  Without evidence of fever or leukocytosis.  Completed antibiotic therapy.  Improved. Mobilize.  Incentive spirometry.  Mucolytic's.  Hyperlipidemia: LDL 101.  Allergic to simvastatin.  Started on Crestor.  Will monitor.  Tolerating.  CKD stage IIIb: Kidney ultrasound with evidence of medical renal disease likely chronic.  Likely baseline.   DVT prophylaxis: SCDs Start: 02/21/23 1739   Code Status: Full code Family Communication: None at the bedside Disposition Plan: Status is: Inpatient Remains inpatient  appropriate because: Heparin infusion, inpatient procedures planned     Consultants:  Cardiology  Procedures:  None  Antimicrobials:  Rocephin and azithromycin 7/5--- 76 Augmentin 7/6---   Subjective:  Patient seen and examined.  He is slightly frustrated given the circumstances.  He is very much informed and aware about his condition, coronary artery disease, untreatable conditions.  He has chosen to go on medications. Patient was asking whether he can go home on cardiac medications.  Diarrhea has improved.  Still has some epigastric discomfort.  Objective: Vitals:   02/26/23 2011 02/27/23 0042 02/27/23 0412 02/27/23 0750  BP: 113/88 119/66 114/63 121/75  Pulse: 62 66  61  Resp: 18 18 18 18   Temp: 98.2 F (36.8 C)   98.4 F (36.9 C)  TempSrc: Oral  Oral Oral  SpO2: 97% 97% 98% 97%  Weight:   67.4 kg   Height:        Intake/Output Summary (Last 24 hours) at 02/27/2023 1159 Last data filed at 02/27/2023 0414 Gross per 24 hour  Intake 819.16 ml  Output 300 ml  Net 519.16 ml   Filed Weights   02/25/23 0451 02/26/23 0350 02/27/23 0412  Weight: 68.3 kg 67.7 kg 67.4 kg    Examination:  General exam: Appears calm and comfortable.  Slightly anxious. Respiratory system: No added sounds.   Cardiovascular system: S1 & S2 heard, RRR.  Gastrointestinal system: Soft.  Nontender. Central nervous system: Alert and oriented. No focal neurological deficits. Extremities: Symmetric 5 x 5 power.    Data Reviewed: I have personally reviewed following labs and imaging studies  CBC: Recent Labs  Lab 02/23/23 0034 02/24/23 0044 02/25/23 0047 02/26/23 0138 02/27/23 0118  WBC 5.6 6.3 8.0 7.5 7.5  HGB 11.5* 11.1* 12.2* 12.0* 12.9*  HCT 34.7* 34.1* 36.4* 36.4* 36.9*  MCV 92.0 90.9 91.5 90.1 86.6  PLT 202 196 190 216 170   Basic Metabolic Panel: Recent Labs  Lab 02/22/23 0105 02/23/23 1006 02/24/23 0044 02/25/23 0047 02/26/23 0138 02/27/23 0118  NA 134* 136 134*  134* 137 135  K 4.1 4.1 3.9 4.0 4.4 4.2  CL 104 103 102 104 105 103  CO2 25 24 24 23 24  19*  GLUCOSE 105* 132* 108* 115* 95 85  BUN 23 17 19 15 16 19   CREATININE 1.65* 1.41* 1.67* 1.49* 1.61* 1.51*  CALCIUM 8.6* 9.1 8.6* 8.9 9.4 9.0  MG 2.2  --   --   --   --   --    GFR: Estimated Creatinine Clearance: 31.7 mL/min (A) (by C-G formula based on SCr of 1.51 mg/dL (H)). Liver Function Tests: Recent Labs  Lab 02/21/23 1428  AST 22  ALT 12  ALKPHOS 79  BILITOT 0.6  PROT 8.0  ALBUMIN 4.4   No results for input(s): "LIPASE", "AMYLASE" in the last 168 hours. No results for input(s): "AMMONIA" in the last 168 hours. Coagulation Profile: No results for input(s): "INR", "PROTIME" in the last 168 hours. Cardiac Enzymes: No results for input(s): "CKTOTAL", "CKMB", "CKMBINDEX", "TROPONINI" in the last 168 hours. BNP (last 3 results) No results for input(s): "PROBNP" in the last 8760 hours. HbA1C: No results for input(s): "HGBA1C" in the last 72 hours. CBG: No results for input(s): "GLUCAP" in the last 168 hours. Lipid Profile: No results for input(s): "CHOL", "HDL", "LDLCALC", "TRIG", "CHOLHDL", "LDLDIRECT" in the last 72 hours.  Thyroid Function Tests: No results for input(s): "TSH", "T4TOTAL", "FREET4", "T3FREE", "THYROIDAB" in the last 72 hours. Anemia Panel: No results for input(s): "VITAMINB12", "FOLATE", "FERRITIN", "TIBC", "IRON", "RETICCTPCT" in the last 72 hours. Sepsis Labs: No results for input(s): "PROCALCITON", "LATICACIDVEN" in the last 168 hours.  Recent Results (from the past 240 hour(s))  SARS Coronavirus 2 by RT PCR (hospital order, performed in [facility] hospital lab) *cepheid single result test* Nasopharyngeal Swab     Status: None   Collection Time: 02/21/23  6:19 PM   Specimen: Nasopharyngeal Swab; Nasal Swab  Result Value Ref Range Status   SARS Coronavirus 2 by RT PCR NEGATIVE NEGATIVE Final    Comment: Performed at [facility], [address], [place], [place] [address]  Respiratory (~20 pathogens) panel by PCR     Status: None   Collection Time: 02/21/23  6:19 PM   Specimen: Nasopharyngeal Swab; Respiratory  Result Value Ref Range Status   Adenovirus NOT DETECTED NOT DETECTED Final   Coronavirus 229E NOT DETECTED NOT DETECTED Final    Comment: (NOTE) The Coronavirus on the Respiratory Panel, DOES NOT test for the novel  Coronavirus (2019 nCoV)    Coronavirus HKU1 NOT DETECTED NOT DETECTED Final   Coronavirus NL63 NOT DETECTED NOT DETECTED Final   Coronavirus OC43 NOT DETECTED NOT DETECTED Final   Metapneumovirus NOT DETECTED NOT DETECTED Final   Rhinovirus / Enterovirus NOT DETECTED NOT DETECTED Final   Influenza A NOT DETECTED NOT DETECTED Final   Influenza B NOT DETECTED NOT DETECTED Final   Parainfluenza Virus 1 NOT DETECTED NOT DETECTED Final   Parainfluenza Virus 2 NOT DETECTED NOT DETECTED Final   Parainfluenza Virus 3 NOT DETECTED NOT DETECTED Final   Parainfluenza Virus 4 NOT DETECTED NOT DETECTED Final   Respiratory Syncytial Virus NOT DETECTED NOT DETECTED Final  Bordetella pertussis NOT DETECTED NOT DETECTED Final   Bordetella Parapertussis NOT DETECTED NOT DETECTED Final   Chlamydophila pneumoniae NOT DETECTED NOT DETECTED Final   Mycoplasma pneumoniae NOT DETECTED NOT DETECTED Final    Comment: Performed at [facility], [address] [place], [place] [address]  Culture, blood (Routine X 2) w Reflex to ID Panel     Status: None   Collection Time: 02/21/23  6:49 PM   Specimen: BLOOD RIGHT ARM  Result Value Ref Range Status   Specimen Description BLOOD RIGHT ARM  Final   Special Requests   Final    BOTTLES DRAWN AEROBIC ONLY Blood Culture results may not be optimal due to an inadequate volume of blood received in culture bottles   Culture   Final    NO GROWTH 5 DAYS Performed at [facility], [address], [place], [place] [address]    Report Status 02/26/2023 FINAL  Final          Radiology Studies: VAS US DOPPLER PRE CABG  Result Date: 02/26/2023 PREOPERATIVE VASCULAR EVALUATION Patient Name:  COLBERT CURENTON  Date of Exam:   02/26/2023 Medical Rec #: [id number]          Accession #:    [id number] Date of Birth: 1935-08-23          Patient Gender: M Patient Age:   23 years Exam Location:  [facility] Procedure:      VAS US DOPPLER PRE CABG Referring Phys: HARRELL LIGHTFOOT --------------------------------------------------------------------------------  Indications:      Pre-CABG. Risk Factors:     Hypertension, hyperlipidemia, coronary artery disease. Comparison Study: No previous study. Performing Technologist: McKayla Maag RVT, [place]  Examination Guidelines: A complete evaluation includes B-mode imaging, spectral Doppler, color Doppler, and power Doppler as needed of all accessible portions of each vessel. Bilateral testing is considered an integral part of a complete examination. Limited examinations for reoccurring indications may be performed as noted.  Right Carotid Findings: +----------+-------+-------+--------+------------------------+-----------------+           PSV    EDV    StenosisDescribe                Comments                    cm/s   cm/s                                                     +----------+-------+-------+--------+------------------------+-----------------+ CCA Prox  73     9                                      intimal                                                                   thickening        +----------+-------+-------+--------+------------------------+-----------------+ CCA Distal72     12  intimal                                                                   thickening        +----------+-------+-------+--------+------------------------+-----------------+ ICA Prox  75     15     1-39%   irregular and                                                              heterogenous                              +----------+-------+-------+--------+------------------------+-----------------+ ICA Distal88     26                                                       +----------+-------+-------+--------+------------------------+-----------------+ ECA       265    14     >50%    irregular and                                                             homogeneous                               +----------+-------+-------+--------+------------------------+-----------------+ +----------+--------+-------+----------------+------------+           PSV cm/sEDV cmsDescribe        Arm Pressure +----------+--------+-------+----------------+------------+ [id number]            Multiphasic, WNL             +----------+--------+-------+----------------+------------+ +---------+--------+--+--------+--+---------+ VertebralPSV cm/s51EDV cm/s13Antegrade +---------+--------+--+--------+--+---------+ Left Carotid Findings: +----------+-------+--------+--------+-----------------------+-----------------+           PSV    EDV cm/sStenosisDescribe               Comments                    cm/s                                                            +----------+-------+--------+--------+-----------------------+-----------------+ CCA Prox  101    13                                     intimal  thickening        +----------+-------+--------+--------+-----------------------+-----------------+ CCA Distal85     14                                     intimal                                                                   thickening        +----------+-------+--------+--------+-----------------------+-----------------+ ICA Prox  107    21      1-39%   irregular and                                                              homogeneous                              +----------+-------+--------+--------+-----------------------+-----------------+ ICA Distal79     23                                                       +----------+-------+--------+--------+-----------------------+-----------------+ ECA       137    17                                                       +----------+-------+--------+--------+-----------------------+-----------------+  +----------+--------+--------+----------------+------------+ SubclavianPSV cm/sEDV cm/sDescribe        Arm Pressure +----------+--------+--------+----------------+------------+           151             Multiphasic, WNL             +----------+--------+--------+----------------+------------+ +---------+--------+--+--------+-+---------+ VertebralPSV cm/s16EDV cm/s7Antegrade +---------+--------+--+--------+-+---------+  ABI Findings: +---------+------------------+-----+---------+--------+ Right    Rt Pressure (mmHg)IndexWaveform Comment  +---------+------------------+-----+---------+--------+ Brachial 156                    triphasic         +---------+------------------+-----+---------+--------+ PTA      161               1.03 triphasic         +---------+------------------+-----+---------+--------+ DP       121               0.78 biphasic          +---------+------------------+-----+---------+--------+ Cletis Media               0.72 Normal            +---------+------------------+-----+---------+--------+ +---------+------------------+-----+---------+---------------------------------+ Left     Lt Pressure (mmHg)IndexWaveform Comment                           +---------+------------------+-----+---------+---------------------------------+  Brachial                        triphasicNo pressure obtained due to IV                                             placement                          +---------+------------------+-----+---------+---------------------------------+ PTA      155               0.99 triphasic                                  +---------+------------------+-----+---------+---------------------------------+ DP       140               0.90 triphasic                                  +---------+------------------+-----+---------+---------------------------------+ Great Toe106               0.68 Normal                                     +---------+------------------+-----+---------+---------------------------------+ +-------+---------------+----------------+ ABI/TBIToday's ABI/TBIPrevious ABI/TBI +-------+---------------+----------------+ Right  1.03/ 0.72                      +-------+---------------+----------------+ Left   0.99/ 0.68                      +-------+---------------+----------------+  Right Doppler Findings: +--------+--------+-----+---------+--------+ Site    PressureIndexDoppler  Comments +--------+--------+-----+---------+--------+ [id number]          triphasic         +--------+--------+-----+---------+--------+ Radial               triphasic         +--------+--------+-----+---------+--------+ Ulnar                biphasic          +--------+--------+-----+---------+--------+  Left Doppler Findings: +--------+--------+-----+---------+----------------------------------------+ Site    PressureIndexDoppler  Comments                                 +--------+--------+-----+---------+----------------------------------------+ Brachial             triphasicNo pressure obtained due to IV placement +--------+--------+-----+---------+----------------------------------------+ Radial               triphasic                                         +--------+--------+-----+---------+----------------------------------------+ Ulnar                triphasic                                          +--------+--------+-----+---------+----------------------------------------+   Summary: Right  Carotid: Velocities in the right ICA are consistent with a 1-39% stenosis.                The ECA appears >50% stenosed. Left Carotid: Velocities in the left ICA are consistent with a 1-39% stenosis. Vertebrals:  Bilateral vertebral arteries demonstrate antegrade flow. Subclavians: Normal flow hemodynamics were seen in bilateral subclavian              arteries. Right ABI: Resting right ankle-brachial index is within normal range. The right toe-brachial index is normal. Left ABI: Resting left ankle-brachial index is within normal range. The left toe-brachial index is normal. Right Upper Extremity: Doppler waveforms remain within normal limits with right radial compression. Doppler waveforms decrease <50% with right ulnar compression. Left Upper Extremity: Doppler waveform obliterate with left radial compression. Doppler waveforms decrease 50% with left ulnar compression.  Electronically signed by Heath Lark on 02/26/2023 at 5:40:11 PM.    Final         Scheduled Meds:  aspirin  81 mg Oral Daily   dapagliflozin propanediol  10 mg Oral Daily   finasteride  5 mg Oral q morning   HYDROcodone-acetaminophen  1 tablet Oral QID   isosorbide mononitrate  30 mg Oral Daily   magnesium oxide  400 mg Oral QHS   melatonin  10 mg Oral QHS   metoprolol succinate  25 mg Oral Daily   omega-3 acid ethyl esters  1 g Oral Daily   pantoprazole  20 mg Oral Daily   rosuvastatin  20 mg Oral Daily   sodium chloride flush  3 mL Intravenous Q12H   sodium chloride flush  3 mL Intravenous Q12H   traZODone  100 mg Oral QHS   Continuous Infusions:  sodium chloride       LOS: 6 days    Time spent: 35 minutes    Dorcas Carrow, MD [facility]

## 2023-02-27 NOTE — Discharge Summary (Signed)
Physician Discharge Summary  Calvin Marks ZOX:[id number] DOB: 03-29-1936 DOA: 02/21/2023  PCP: Renford Dills, MD  Admit date: 02/21/2023 Discharge date: 02/27/2023  Admitted From: Home Disposition: Home  Recommendations for Outpatient Follow-up:  Follow up with PCP in 1-2 weeks Cardiology clinic to schedule follow-up  Home Health: N/A Equipment/Devices: N/A  Discharge Condition: Stable CODE STATUS: Full code Diet recommendation: Low-salt diet  Discharge summary: 87 year old with history of hypertension presented to the emergency room with last 3 days of intermittent left lower chest wall pain and likely shortness of breath.  He does have history of hypertension, BPH and GERD as well as depression.  Lives at home.  He is complaining of same symptoms since last 1 month.  In the emergency room ,hemodynamically stable.  Troponins 415-300 and trending down.  Chest x-ray with subtle opacity left lung base new from previous findings.  Admitted with IV antibiotics, cardiology consulted due to elevated troponins and started on heparin. Patient ultimately underwent cardiac catheterization and found to have severe triple-vessel disease and ejection fraction of 35%.  He was considered high risk for revascularization procedure including CABG.  Opted for medical treatment.  He is going home today with medical management as below.  # Non-ST elevation MI: Severe triple-vessel coronary artery disease.  Ischemic cardiomyopathy with ejection fraction of 35%. Currently hemodynamically stable.  Remains on aspirin, IV heparin, statin.  Echocardiogram with reduced ejection fraction 35%.   Patient underwent cardiac catheterization and found to have severe triple-vessel coronary artery disease.  Recommended revascularization with CT surgery. Patient decided against CABG after informed decision. Plan was made to treat medically with following Aspirin and Plavix to continue Imdur, metoprolol, Farxiga,  statin. Cardiology to schedule outpatient follow-up.   Acute systolic congestive heart failure: Currently euvolemic.  EF 35%.  Remains on metoprolol, statin.  Farxiga added .  Euvolemic.   Suspected pneumonia left lower lobe: Less likely.  Without evidence of fever or leukocytosis.  Completed antibiotic therapy.  Improved. Mobilize.  Incentive spirometry.  Mucolytic's.   Hyperlipidemia: LDL 101.  Allergic to simvastatin.  Started on Crestor.  Will monitor.  Tolerating so far.    CKD stage IIIb: Kidney ultrasound with evidence of medical renal disease likely chronic.  Likely baseline.  Patient is currently stable to discharge home.  Cardiology to schedule outpatient follow-up.  Discharge Diagnoses:  Principal Problem:   NSTEMI (non-ST elevated myocardial infarction) (HCC) Active Problems:   Lung infiltrate   Kidney disease    Discharge Instructions  Discharge Instructions     AMB referral to Phase II Cardiac Rehabilitation   Complete by: As directed    Diagnosis: NSTEMI   After initial evaluation and assessments completed: Virtual Based Care may be provided alone or in conjunction with Phase 2 Cardiac Rehab based on patient barriers.: Yes   Intensive Cardiac Rehabilitation (ICR) [facility] location only OR Traditional Cardiac Rehabilitation (TCR) *If criteria for ICR are not met will enroll in TCR [facility] only): Yes   Call MD for:  difficulty breathing, headache or visual disturbances   Complete by: As directed    Call MD for:  severe uncontrolled pain   Complete by: As directed    Diet - low sodium heart healthy   Complete by: As directed    Increase activity slowly   Complete by: As directed       Allergies as of 02/27/2023       Reactions   Lisinopril Other (See Comments)   Caused trouble urinating and soreness  in the lower abdomen   Pravastatin Other (See Comments)   Leg cramps   Simvastatin Other (See Comments)   Welts, fever, headaches   Ciprofloxacin Hives, Rash         Medication List     STOP taking these medications    amLODipine 2.5 MG tablet Commonly known as: NORVASC   meloxicam 15 MG tablet Commonly known as: MOBIC   telmisartan-hydrochlorothiazide 80-25 MG tablet Commonly known as: MICARDIS HCT       TAKE these medications    aspirin 81 MG chewable tablet Chew 1 tablet (81 mg total) by mouth daily. Start taking on: February 28, 2023   clopidogrel 75 MG tablet Commonly known as: Plavix Take 1 tablet (75 mg total) by mouth daily.   dapagliflozin propanediol 10 MG Tabs tablet Commonly known as: FARXIGA Take 1 tablet (10 mg total) by mouth daily. Start taking on: February 28, 2023   finasteride 5 MG tablet Commonly known as: PROSCAR Take 5 mg by mouth every morning.   fish oil-omega-3 fatty acids 1000 MG capsule Take 1 g by mouth every morning.   HYDROcodone-acetaminophen 7.5-325 MG tablet Commonly known as: NORCO Take 1 tablet by mouth 4 (four) times daily.   isosorbide mononitrate 30 MG 24 hr tablet Commonly known as: IMDUR Take 1 tablet (30 mg total) by mouth daily. Start taking on: February 28, 2023   lansoprazole 30 MG capsule Commonly known as: PREVACID Take 30 mg by mouth daily in the afternoon.   magnesium oxide 400 (240 Mg) MG tablet Commonly known as: MAG-OX Take 400 mg by mouth at bedtime.   Melatonin 10 MG Tabs Take 10 mg by mouth at bedtime.   metoprolol succinate 25 MG 24 hr tablet Commonly known as: TOPROL-XL Take 1 tablet (25 mg total) by mouth daily. Start taking on: February 28, 2023   polyethylene glycol 17 g packet Commonly known as: MIRALAX / GLYCOLAX Take 8.5 g by mouth in the morning.   rosuvastatin 20 MG tablet Commonly known as: CRESTOR Take 1 tablet (20 mg total) by mouth daily. Start taking on: February 28, 2023   traZODone 100 MG tablet Commonly known as: DESYREL Take 100 mg by mouth at bedtime.        Follow-up Information     [facility]. Go in 8 day(s).   Specialty: Cardiology Why: Hospital follow up 03/06/2023 @ 12 noon PLease bring a current medication list to appointment FREE valet parking, Entrance C, off [employer] information: [address] [id number] [place] [address] [phone]               Allergies  Allergen Reactions   Lisinopril Other (See Comments)    Caused trouble urinating and soreness in the lower abdomen   Pravastatin Other (See Comments)    Leg cramps   Simvastatin Other (See Comments)    Welts, fever, headaches   Ciprofloxacin Hives and Rash    Consultations: Cardiology CT VS   Procedures/Studies: VAS US DOPPLER PRE CABG  Result Date: 02/26/2023 PREOPERATIVE VASCULAR EVALUATION Patient Name:  Calvin Marks  Date of Exam:   02/26/2023 Medical Rec #: [id number]          Accession #:    [id number] Date of Birth: 01/15/1936          Patient Gender: M Patient Age:   54 years Exam Location:  [facility] Procedure:  VAS US DOPPLER PRE CABG Referring Phys: HARRELL LIGHTFOOT --------------------------------------------------------------------------------  Indications:      Pre-CABG. Risk Factors:     Hypertension, hyperlipidemia, coronary artery disease. Comparison Study: No previous study. Performing Technologist: McKayla Maag RVT, [place]  Examination Guidelines: A complete evaluation includes B-mode imaging, spectral Doppler, color Doppler, and power Doppler as needed of all accessible portions of each vessel. Bilateral testing is considered an integral part of a complete examination. Limited examinations for reoccurring indications may be performed as noted.  Right Carotid Findings: +----------+-------+-------+--------+------------------------+-----------------+           PSV    EDV    StenosisDescribe                Comments                    cm/s   cm/s                                                      +----------+-------+-------+--------+------------------------+-----------------+ CCA Prox  73     9                                      intimal                                                                   thickening        +----------+-------+-------+--------+------------------------+-----------------+ CCA Distal72     12                                     intimal                                                                   thickening        +----------+-------+-------+--------+------------------------+-----------------+ ICA Prox  75     15     1-39%   irregular and                                                             heterogenous                              +----------+-------+-------+--------+------------------------+-----------------+ ICA Distal88     26                                                       +----------+-------+-------+--------+------------------------+-----------------+  ECA       265    14     >50%    irregular and                                                             homogeneous                               +----------+-------+-------+--------+------------------------+-----------------+ +----------+--------+-------+----------------+------------+           PSV cm/sEDV cmsDescribe        Arm Pressure +----------+--------+-------+----------------+------------+ [id number]            Multiphasic, WNL             +----------+--------+-------+----------------+------------+ +---------+--------+--+--------+--+---------+ VertebralPSV cm/s51EDV cm/s13Antegrade +---------+--------+--+--------+--+---------+ Left Carotid Findings: +----------+-------+--------+--------+-----------------------+-----------------+           PSV    EDV cm/sStenosisDescribe               Comments                    cm/s                                                             +----------+-------+--------+--------+-----------------------+-----------------+ CCA Prox  101    13                                     intimal                                                                   thickening        +----------+-------+--------+--------+-----------------------+-----------------+ CCA Distal85     14                                     intimal                                                                   thickening        +----------+-------+--------+--------+-----------------------+-----------------+ ICA Prox  107    21      1-39%   irregular and  homogeneous                              +----------+-------+--------+--------+-----------------------+-----------------+ ICA Distal79     23                                                       +----------+-------+--------+--------+-----------------------+-----------------+ ECA       137    17                                                       +----------+-------+--------+--------+-----------------------+-----------------+  +----------+--------+--------+----------------+------------+ SubclavianPSV cm/sEDV cm/sDescribe        Arm Pressure +----------+--------+--------+----------------+------------+           151             Multiphasic, WNL             +----------+--------+--------+----------------+------------+ +---------+--------+--+--------+-+---------+ VertebralPSV cm/s16EDV cm/s7Antegrade +---------+--------+--+--------+-+---------+  ABI Findings: +---------+------------------+-----+---------+--------+ Right    Rt Pressure (mmHg)IndexWaveform Comment  +---------+------------------+-----+---------+--------+ Brachial 156                    triphasic         +---------+------------------+-----+---------+--------+ PTA      161               1.03 triphasic          +---------+------------------+-----+---------+--------+ DP       121               0.78 biphasic          +---------+------------------+-----+---------+--------+ Cletis Media               0.72 Normal            +---------+------------------+-----+---------+--------+ +---------+------------------+-----+---------+---------------------------------+ Left     Lt Pressure (mmHg)IndexWaveform Comment                           +---------+------------------+-----+---------+---------------------------------+ Brachial                        triphasicNo pressure obtained due to IV                                             placement                         +---------+------------------+-----+---------+---------------------------------+ PTA      155               0.99 triphasic                                  +---------+------------------+-----+---------+---------------------------------+ DP       140               0.90 triphasic                                  +---------+------------------+-----+---------+---------------------------------+  Great Toe106               0.68 Normal                                     +---------+------------------+-----+---------+---------------------------------+ +-------+---------------+----------------+ ABI/TBIToday's ABI/TBIPrevious ABI/TBI +-------+---------------+----------------+ Right  1.03/ 0.72                      +-------+---------------+----------------+ Left   0.99/ 0.68                      +-------+---------------+----------------+  Right Doppler Findings: +--------+--------+-----+---------+--------+ Site    PressureIndexDoppler  Comments +--------+--------+-----+---------+--------+ [id number]          triphasic         +--------+--------+-----+---------+--------+ Radial               triphasic         +--------+--------+-----+---------+--------+ Ulnar                biphasic           +--------+--------+-----+---------+--------+  Left Doppler Findings: +--------+--------+-----+---------+----------------------------------------+ Site    PressureIndexDoppler  Comments                                 +--------+--------+-----+---------+----------------------------------------+ Brachial             triphasicNo pressure obtained due to IV placement +--------+--------+-----+---------+----------------------------------------+ Radial               triphasic                                         +--------+--------+-----+---------+----------------------------------------+ Ulnar                triphasic                                         +--------+--------+-----+---------+----------------------------------------+   Summary: Right Carotid: Velocities in the right ICA are consistent with a 1-39% stenosis.                The ECA appears >50% stenosed. Left Carotid: Velocities in the left ICA are consistent with a 1-39% stenosis. Vertebrals:  Bilateral vertebral arteries demonstrate antegrade flow. Subclavians: Normal flow hemodynamics were seen in bilateral subclavian              arteries. Right ABI: Resting right ankle-brachial index is within normal range. The right toe-brachial index is normal. Left ABI: Resting left ankle-brachial index is within normal range. The left toe-brachial index is normal. Right Upper Extremity: Doppler waveforms remain within normal limits with right radial compression. Doppler waveforms decrease <50% with right ulnar compression. Left Upper Extremity: Doppler waveform obliterate with left radial compression. Doppler waveforms decrease 50% with left ulnar compression.  Electronically signed by Heath Lark on 02/26/2023 at 5:40:11 PM.    Final    CARDIAC CATHETERIZATION  Result Date: 02/24/2023   Prox RCA lesion is 70% stenosed.   RPDA lesion is 100% stenosed.   Ost LM lesion is 40% stenosed.   Ost LAD to Prox LAD lesion is 90% stenosed.   Mid  LAD lesion is 95% stenosed.  1st Diag-1 lesion is 95% stenosed.   1st Diag-2 lesion is 90% stenosed.   Ost Cx to Prox Cx lesion is 80% stenosed.   Prox Cx to Mid Cx lesion is 95% stenosed. 1.  Severe multivessel disease. 2.  LVEDP of 17 mmHg. Recommendation: High complexity disease best served by surgical revascularization.   VAS [place] LOWER EXTREMITY VENOUS (DVT)  Result Date: 02/22/2023  Lower Venous DVT Study Patient Name:  Calvin Marks  Date of Exam:   02/22/2023 Medical Rec #: [id number]          Accession #:    [id number] Date of Birth: [birth year]          Patient Gender: M Patient Age:   56 years Exam [facility]:  [facility] Procedure:      VAS [place] LOWER EXTREMITY VENOUS (DVT) Referring Phys: [facility] GOEL --------------------------------------------------------------------------------  Indications: Swelling.  Comparison Study: No prior study on file Performing Technologist: Sherren Kerns RVS  Examination Guidelines: A complete evaluation includes B-mode imaging, spectral Doppler, color Doppler, and power Doppler as needed of all accessible portions of each vessel. Bilateral testing is considered an integral part of a complete examination. Limited examinations for reoccurring indications may be performed as noted. The reflux portion of the exam is performed with the patient in reverse Trendelenburg.  +---------+---------------+---------+-----------+----------+--------------+ RIGHT    CompressibilityPhasicitySpontaneityPropertiesThrombus Aging +---------+---------------+---------+-----------+----------+--------------+ CFV      Full           Yes      Yes                                 +---------+---------------+---------+-----------+----------+--------------+ SFJ      Full                                                        +---------+---------------+---------+-----------+----------+--------------+ FV Prox  Full                                                         +---------+---------------+---------+-----------+----------+--------------+ FV Mid   Full                                                        +---------+---------------+---------+-----------+----------+--------------+ FV DistalFull                                                        +---------+---------------+---------+-----------+----------+--------------+ PFV      Full                                                        +---------+---------------+---------+-----------+----------+--------------+ POP  Full           Yes      Yes                                 +---------+---------------+---------+-----------+----------+--------------+ PTV      Full                                                        +---------+---------------+---------+-----------+----------+--------------+ PERO     Full                                                        +---------+---------------+---------+-----------+----------+--------------+   +---------+---------------+---------+-----------+----------+--------------+ LEFT     CompressibilityPhasicitySpontaneityPropertiesThrombus Aging +---------+---------------+---------+-----------+----------+--------------+ CFV      Full           Yes      Yes                                 +---------+---------------+---------+-----------+----------+--------------+ SFJ      Full                                                        +---------+---------------+---------+-----------+----------+--------------+ FV Prox  Full                                                        +---------+---------------+---------+-----------+----------+--------------+ FV Mid   Full                                                        +---------+---------------+---------+-----------+----------+--------------+ FV DistalFull                                                         +---------+---------------+---------+-----------+----------+--------------+ PFV      Full                                                        +---------+---------------+---------+-----------+----------+--------------+ POP      Full           Yes      Yes                                 +---------+---------------+---------+-----------+----------+--------------+  PTV      Full                                                        +---------+---------------+---------+-----------+----------+--------------+ PERO     Full                                                        +---------+---------------+---------+-----------+----------+--------------+     Summary: BILATERAL: - No evidence of deep vein thrombosis seen in the lower extremities, bilaterally. -No evidence of popliteal cyst, bilaterally.   *See table(s) above for measurements and observations. Electronically signed by Lemar Livings MD on 02/22/2023 at 3:52:27 PM.    Final    ECHOCARDIOGRAM COMPLETE  Result Date: 02/22/2023    ECHOCARDIOGRAM REPORT   Patient Name:   Calvin Marks Date of Exam: 02/22/2023 Medical Rec #:  [id number]         Height:       65.0 in Accession #:    [id number]        Weight:       152.8 lb Date of Birth:  Feb 02, 1936         BSA:          1.764 m Patient Age:    86 years          BP:           116/58 mmHg Patient Gender: M                 HR:           74 bpm. Exam Location:  Inpatient Procedure: 2D Echo, Cardiac Doppler and Color Doppler Indications:    NSTEMI  History:        Patient has prior history of Echocardiogram examinations, most                 recent 07/09/2017. Acute MI and NSTEMI; CKD.  Sonographer:    Milbert Coulter Referring Phys: [id number] [facility] GOEL IMPRESSIONS  1. Akinesis of the inferolateral wall and basal inferior wall; overall moderate LV dysfunction.  2. Left ventricular ejection fraction, by estimation, is 35 to 40%. The left ventricle has moderately decreased function. The left  ventricle demonstrates regional wall motion abnormalities (see scoring diagram/findings for description). Left ventricular  diastolic parameters are consistent with Grade I diastolic dysfunction (impaired relaxation).  3. Right ventricular systolic function is normal. The right ventricular size is normal.  4. The mitral valve is normal in structure. Moderate mitral valve regurgitation. No evidence of mitral stenosis.  5. The aortic valve is tricuspid. Aortic valve regurgitation is trivial. Aortic valve sclerosis is present, with no evidence of aortic valve stenosis.  6. The inferior vena cava is normal in size with greater than 50% respiratory variability, suggesting right atrial pressure of 3 mmHg. FINDINGS  Left Ventricle: Left ventricular ejection fraction, by estimation, is 35 to 40%. The left ventricle has moderately decreased function. The left ventricle demonstrates regional wall motion abnormalities. The left ventricular internal cavity size was normal in size. There is no left ventricular hypertrophy. Left ventricular diastolic parameters are consistent with Grade I diastolic dysfunction (impaired relaxation).  Right Ventricle: The right ventricular size is normal. Right ventricular systolic function is normal. Left Atrium: Left atrial size was normal in size. Right Atrium: Right atrial size was normal in size. Pericardium: There is no evidence of pericardial effusion. Mitral Valve: The mitral valve is normal in structure. Mild mitral annular calcification. Moderate mitral valve regurgitation. No evidence of mitral valve stenosis. Tricuspid Valve: The tricuspid valve is normal in structure. Tricuspid valve regurgitation is trivial. No evidence of tricuspid stenosis. Aortic Valve: The aortic valve is tricuspid. Aortic valve regurgitation is trivial. Aortic valve sclerosis is present, with no evidence of aortic valve stenosis. Aortic valve mean gradient measures 2.0 mmHg. Aortic valve peak gradient measures  3.5 mmHg. Aortic valve area, by VTI measures 2.36 cm. Pulmonic Valve: The pulmonic valve was normal in structure. Pulmonic valve regurgitation is trivial. No evidence of pulmonic stenosis. Aorta: The aortic root is normal in size and structure. Venous: The inferior vena cava is normal in size with greater than 50% respiratory variability, suggesting right atrial pressure of 3 mmHg. IAS/Shunts: No atrial level shunt detected by color flow Doppler. Additional Comments: Akinesis of the inferolateral wall and basal inferior wall; overall moderate LV dysfunction.  LEFT VENTRICLE PLAX 2D LVIDd:         4.20 cm   Diastology LVIDs:         3.60 cm   LV e' medial:    6.96 cm/s LV PW:         0.90 cm   LV E/e' medial:  9.9 LV IVS:        0.90 cm   LV e' lateral:   8.49 cm/s LVOT diam:     2.10 cm   LV E/e' lateral: 8.1 LV SV:         46 LV SV Index:   26 LVOT Area:     3.46 cm  RIGHT VENTRICLE RV S prime:     19.00 cm/s TAPSE (M-mode): 2.0 cm LEFT ATRIUM             Index        RIGHT ATRIUM           Index LA diam:        3.70 cm 2.10 cm/m   RA Area:     16.10 cm LA Vol (A2C):   46.0 ml 26.07 ml/m  RA Volume:   37.30 ml  21.14 ml/m LA Vol (A4C):   57.4 ml 32.53 ml/m LA Biplane Vol: 52.1 ml 29.53 ml/m  AORTIC VALVE AV Area (Vmax):    2.37 cm AV Area (Vmean):   2.29 cm AV Area (VTI):     2.36 cm AV Vmax:           94.10 cm/s AV Vmean:          62.700 cm/s AV VTI:            0.195 m AV Peak Grad:      3.5 mmHg AV Mean Grad:      2.0 mmHg LVOT Vmax:         64.50 cm/s LVOT Vmean:        41.400 cm/s LVOT VTI:          0.133 m LVOT/AV VTI ratio: 0.68  AORTA Ao Root diam: 3.30 cm MITRAL VALVE MV Area (PHT): 3.83 cm    SHUNTS MV Decel Time: 198 msec    Systemic VTI:  0.13 m MV E velocity: 68.70 cm/s  Systemic Diam:  2.10 cm MV A velocity: 75.50 cm/s MV E/A ratio:  0.91 Olga Millers MD Electronically signed by Olga Millers MD Signature Date/Time: 02/22/2023/3:46:19 PM    Final    US RENAL  Result Date:  02/21/2023 CLINICAL DATA:  Renal failure. EXAM: RENAL / URINARY TRACT ULTRASOUND COMPLETE COMPARISON:  None Available. FINDINGS: Right Kidney: Renal measurements: 10.3 x 4.6 x 3.8 cm = volume: 93 mL. Thinning of the renal parenchyma with increased echogenicity. No hydronephrosis. Simple cyst in the mid kidney measures 2.8 cm. No evidence of solid lesion or stone. Left Kidney: Renal measurements: 6.7 x 3.2 x 3 cm = volume: 33 mL. Parenchymal atrophy with thinning of the cortex and increased echogenicity. No hydronephrosis. Limited assessment, although no evidence of focal lesion or stone. Bladder: Limited assessment.  Both ureteral jets are demonstrated. Other: None. IMPRESSION: 1. No obstructive uropathy. 2. Left renal atrophy. Thinning of both renal parenchyma (left-greater-than-right) with increased echogenicity typical of chronic medical renal disease. 3. Right renal cyst needs no further imaging follow-up. Electronically Signed   By: Narda Rutherford M.D.   On: 02/21/2023 19:37   DG Chest 2 View  Result Date: 02/21/2023 CLINICAL DATA:  Shortness of breath EXAM: CHEST - 2 VIEW COMPARISON:  X-ray 12/13/2013 FINDINGS: No pneumothorax or effusion. Slight opacity at the left lung base. New from previous. Acute process is possible. Underlying chronic lung changes. No pneumothorax or edema. Normal cardiopericardial silhouette. Calcified aorta. Overlapping cardiac leads. Degenerative changes of the spine. Fixation hardware in the lumbar spine region at the edge of the imaging field. IMPRESSION: Subtle opacity left lung base new from previous. An acute infiltrate is possible. Recommend follow-up. Underlying chronic changes as well. Electronically Signed   By: Karen Kays M.D.   On: 02/21/2023 15:05   (Echo, Carotid, EGD, Colonoscopy, ERCP)    Subjective: Patient seen in the morning rounds.  He is just disappointed given circumstances but wants to go home.  Denies any chest pain or shortness of breath.  He thinks  he still has some congestion.   Discharge Exam: Vitals:   02/27/23 0412 02/27/23 0750  BP: 114/63 121/75  Pulse:  61  Resp: 18 18  Temp:  98.4 F (36.9 C)  SpO2: 98% 97%   Vitals:   02/26/23 2011 02/27/23 0042 02/27/23 0412 02/27/23 0750  BP: 113/88 119/66 114/63 121/75  Pulse: 62 66  61  Resp: 18 18 18 18   Temp: 98.2 F (36.8 C)   98.4 F (36.9 C)  TempSrc: Oral  Oral Oral  SpO2: 97% 97% 98% 97%  Weight:   67.4 kg   Height:        General: Pt is alert, awake, not in acute distress Cardiovascular: RRR, S1/S2 +, no rubs, no gallops Respiratory: CTA bilaterally, no wheezing, no rhonchi Abdominal: Soft, NT, ND, bowel sounds + Extremities: no edema, no cyanosis    The results of significant diagnostics from this hospitalization (including imaging, microbiology, ancillary and laboratory) are listed below for reference.     Microbiology: Recent Results (from the past 240 hour(s))  SARS Coronavirus 2 by RT PCR (hospital order, performed in [facility] hospital lab) *cepheid single result test* Nasopharyngeal Swab     Status: None   Collection Time: 02/21/23  6:19 PM   Specimen: Nasopharyngeal Swab; Nasal Swab  Result Value Ref Range Status   SARS Coronavirus 2 by RT PCR NEGATIVE NEGATIVE Final    Comment: Performed at [facility], [address], [place],  [place] [address]  Respiratory (~20 pathogens) panel by PCR     Status: None   Collection Time: 02/21/23  6:19 PM   Specimen: Nasopharyngeal Swab; Respiratory  Result Value Ref Range Status   Adenovirus NOT DETECTED NOT DETECTED Final   Coronavirus 229E NOT DETECTED NOT DETECTED Final    Comment: (NOTE) The Coronavirus on the Respiratory Panel, DOES NOT test for the novel  Coronavirus (2019 nCoV)    Coronavirus HKU1 NOT DETECTED NOT DETECTED Final   Coronavirus NL63 NOT DETECTED NOT DETECTED Final   Coronavirus OC43 NOT DETECTED NOT DETECTED Final   Metapneumovirus NOT DETECTED NOT DETECTED Final    Rhinovirus / Enterovirus NOT DETECTED NOT DETECTED Final   Influenza A NOT DETECTED NOT DETECTED Final   Influenza B NOT DETECTED NOT DETECTED Final   Parainfluenza Virus 1 NOT DETECTED NOT DETECTED Final   Parainfluenza Virus 2 NOT DETECTED NOT DETECTED Final   Parainfluenza Virus 3 NOT DETECTED NOT DETECTED Final   Parainfluenza Virus 4 NOT DETECTED NOT DETECTED Final   Respiratory Syncytial Virus NOT DETECTED NOT DETECTED Final   Bordetella pertussis NOT DETECTED NOT DETECTED Final   Bordetella Parapertussis NOT DETECTED NOT DETECTED Final   Chlamydophila pneumoniae NOT DETECTED NOT DETECTED Final   Mycoplasma pneumoniae NOT DETECTED NOT DETECTED Final    Comment: Performed at [facility], [address], [place], [place] [address]  Culture, blood (Routine X 2) w Reflex to ID Panel     Status: None   Collection Time: 02/21/23  6:49 PM   Specimen: BLOOD RIGHT ARM  Result Value Ref Range Status   Specimen Description BLOOD RIGHT ARM  Final   Special Requests   Final    BOTTLES DRAWN AEROBIC ONLY Blood Culture results may not be optimal due to an inadequate volume of blood received in culture bottles   Culture   Final    NO GROWTH 5 DAYS Performed at [facility], [address] [place], [place] [address]    Report Status 02/26/2023 FINAL  Final     Labs: BNP (last 3 results) Recent Labs    02/21/23 1428  BNP 205.8*   Basic Metabolic Panel: Recent Labs  Lab 02/22/23 0105 02/23/23 1006 02/24/23 0044 02/25/23 0047 02/26/23 0138 02/27/23 0118  NA 134* 136 134* 134* 137 135  K 4.1 4.1 3.9 4.0 4.4 4.2  CL 104 103 102 104 105 103  CO2 25 24 24 23 24  19*  GLUCOSE 105* 132* 108* 115* 95 85  BUN 23 17 19 15 16 19   CREATININE 1.65* 1.41* 1.67* 1.49* 1.61* 1.51*  CALCIUM 8.6* 9.1 8.6* 8.9 9.4 9.0  MG 2.2  --   --   --   --   --    Liver Function Tests: Recent Labs  Lab 02/21/23 1428  AST 22  ALT 12  ALKPHOS 79  BILITOT 0.6  PROT 8.0  ALBUMIN 4.4   No  results for input(s): "LIPASE", "AMYLASE" in the last 168 hours. No results for input(s): "AMMONIA" in the last 168 hours. CBC: Recent Labs  Lab 02/23/23 0034 02/24/23 0044 02/25/23 0047 02/26/23 0138 02/27/23 0118  WBC 5.6 6.3 8.0 7.5 7.5  HGB 11.5* 11.1* 12.2* 12.0* 12.9*  HCT 34.7* 34.1* 36.4* 36.4* 36.9*  MCV 92.0 90.9 91.5 90.1 86.6  PLT 202 196 190 216 170   Cardiac Enzymes: No results for input(s): "CKTOTAL", "CKMB", "CKMBINDEX", "TROPONINI" in the last 168 hours. BNP: Invalid input(s): "POCBNP"  CBG: No results for input(s): "GLUCAP" in the last 168 hours. D-Dimer No results for input(s): "DDIMER" in the last 72 hours. Hgb A1c No results for input(s): "HGBA1C" in the last 72 hours. Lipid Profile No results for input(s): "CHOL", "HDL", "LDLCALC", "TRIG", "CHOLHDL", "LDLDIRECT" in the last 72 hours. Thyroid function studies No results for input(s): "TSH", "T4TOTAL", "T3FREE", "THYROIDAB" in the last 72 hours.  Invalid input(s): "FREET3" Anemia work up No results for input(s): "VITAMINB12", "FOLATE", "FERRITIN", "TIBC", "IRON", "RETICCTPCT" in the last 72 hours. Urinalysis    Component Value Date/Time   COLORURINE YELLOW 02/21/2023 1733   APPEARANCEUR CLEAR 02/21/2023 1733   LABSPEC 1.016 02/21/2023 1733   PHURINE 6.0 02/21/2023 1733   GLUCOSEU NEGATIVE 02/21/2023 1733   HGBUR NEGATIVE 02/21/2023 1733   BILIRUBINUR NEGATIVE 02/21/2023 1733   KETONESUR NEGATIVE 02/21/2023 1733   PROTEINUR NEGATIVE 02/21/2023 1733   NITRITE NEGATIVE 02/21/2023 1733   LEUKOCYTESUR NEGATIVE 02/21/2023 1733   Sepsis Labs Recent Labs  Lab 02/24/23 0044 02/25/23 0047 02/26/23 0138 02/27/23 0118  WBC 6.3 8.0 7.5 7.5   Microbiology Recent Results (from the past 240 hour(s))  SARS Coronavirus 2 by RT PCR (hospital order, performed in [facility] hospital lab) *cepheid single result test* Nasopharyngeal Swab     Status: None   Collection Time: 02/21/23  6:19 PM   Specimen:  Nasopharyngeal Swab; Nasal Swab  Result Value Ref Range Status   SARS Coronavirus 2 by RT PCR NEGATIVE NEGATIVE Final    Comment: Performed at [facility], [address] [place], [place] [address]  Respiratory (~20 pathogens) panel by PCR     Status: None   Collection Time: 02/21/23  6:19 PM   Specimen: Nasopharyngeal Swab; Respiratory  Result Value Ref Range Status   Adenovirus NOT DETECTED NOT DETECTED Final   Coronavirus 229E NOT DETECTED NOT DETECTED Final    Comment: (NOTE) The Coronavirus on the Respiratory Panel, DOES NOT test for the novel  Coronavirus (2019 nCoV)    Coronavirus HKU1 NOT DETECTED NOT DETECTED Final   Coronavirus NL63 NOT DETECTED NOT DETECTED Final   Coronavirus OC43 NOT DETECTED NOT DETECTED Final   Metapneumovirus NOT DETECTED NOT DETECTED Final   Rhinovirus / Enterovirus NOT DETECTED NOT DETECTED Final   Influenza A NOT DETECTED NOT DETECTED Final   Influenza B NOT DETECTED NOT DETECTED Final   Parainfluenza Virus 1 NOT DETECTED NOT DETECTED Final   Parainfluenza Virus 2 NOT DETECTED NOT DETECTED Final   Parainfluenza Virus 3 NOT DETECTED NOT DETECTED Final   Parainfluenza Virus 4 NOT DETECTED NOT DETECTED Final   Respiratory Syncytial Virus NOT DETECTED NOT DETECTED Final   Bordetella pertussis NOT DETECTED NOT DETECTED Final   Bordetella Parapertussis NOT DETECTED NOT DETECTED Final   Chlamydophila pneumoniae NOT DETECTED NOT DETECTED Final   Mycoplasma pneumoniae NOT DETECTED NOT DETECTED Final    Comment: Performed at [facility], [address] [place], [place] [address]  Culture, blood (Routine X 2) w Reflex to ID Panel     Status: None   Collection Time: 02/21/23  6:49 PM   Specimen: BLOOD RIGHT ARM  Result Value Ref Range Status   Specimen Description BLOOD RIGHT ARM  Final   Special Requests   Final    BOTTLES DRAWN AEROBIC ONLY Blood Culture results may not be optimal due to an inadequate volume of blood received in culture  bottles   Culture   Final    NO GROWTH 5  DAYS Performed at [facility], [address], [place], [place] [address]    Report Status 02/26/2023 FINAL  Final     Time coordinating discharge: 35 minutes  SIGNED:   Dorcas Carrow, MD  [facility] 02/27/2023, 12:42 PM

## 2023-02-27 NOTE — Telephone Encounter (Signed)
Pharmacy Patient Advocate Encounter  Received notification from [facility] that Prior Authorization for Farxiga 10MG  tablets has been APPROVED from 02/27/2023 to 02/27/2024.[name] Kitchen  PA #/Case ID/Reference #: [id number]

## 2023-02-27 NOTE — Progress Notes (Signed)
   Heart Failure Stewardship Pharmacist Progress Note   PCP: Renford Dills, MD PCP-Cardiologist: Donato Schultz, MD    HPI:  87 yo M with PMH of HTN, BPH, GERD, and depression.   Presented to the ED on 7/5 with chest pain and shortness of breath. He was in his usual state of health until a month ago when he began noticing epigastric/chest pain with exertion.  BNP slightly elevated. ECHO on 7/6 showed LVEF 35-40%, regional wall motion abnormalities, no LVH, G1DD, moderate MR. Cath on 7/8 showed severe multivessel disease, LVEDP 17. TCTS consulted for possible CABG vs PCI vs medical management. He is not interested in surgery and is proceeding with medical management.   Current HF Medications: Beta Blocker: metoprolol XL 25 mg daily SGLT2i: Farxiga 10 mg daily  Prior to admission HF Medications: ACE/ARB/ARNI: telmisartan-hydrochlorothiazide 80/25 mg daily *also on amlodipine 2.5 mg daily   Pertinent Lab Values: Serum creatinine 1.51, BUN 19, Potassium 4.2, Sodium 135, BNP 205.8, Magnesium 2.2  Vital Signs: Weight: 148 lbs (admission weight: 152 lbs) Blood pressure: 120/70s  Heart rate: 60s  I/O: net even yesterday; net +1.8L since admission  Medication Assistance / Insurance Benefits Check: Does the patient have prescription insurance?  Yes Type of insurance plan: Investment banker, corporate  Outpatient Pharmacy:  Prior to admission outpatient pharmacy: [facility] Is the patient willing to use [facility] TOC pharmacy at discharge? Yes Is the patient willing to transition their outpatient pharmacy to utilize a [facility] outpatient pharmacy?   No    Assessment: 1. Acute systolic CHF (LVEF 35-40%), due to ICM. NYHA class II symptoms. - Not volume overloaded on exam, no indications for diuretics. Keep K>4 and Mg>2. - Continue metoprolol XL 25 mg daily - Consider adding spironolactone 12.5 mg daily - Continue Farxiga 10 mg daily - Consider discontinuing amlodipine and hydrochlorothiazide  on discharge  Plan: 1) Medication changes recommended at this time: - Add spironolactone 12.5 mg daily  2) Patient assistance: - Has federal employee insurance - can use copay cards - Farxiga copay card lowers to $0 per month  3)  Education  - Patient has been educated on current HF medications and potential additions to HF medication regimen - Patient verbalizes understanding that over the next few months, these medication doses may change and more medications may be added to optimize HF regimen - Patient has been educated on basic disease state pathophysiology and goals of therapy   Sharen Hones, PharmD, BCPS Heart Failure Engineer, building services Phone [phone]

## 2023-02-27 NOTE — TOC Transition Note (Signed)
Transition of Care [facility]) - CM/SW Discharge Note   Patient Details  Name: Calvin Marks MRN: [id number] Date of Birth: 01-Sep-1935  Transition of [facility]) CM/SW Contact:  Leone Haven, RN Phone Number: 02/27/2023, 1:24 PM   Clinical Narrative:    Patient is for dc today, has no needs, he has transport at dc.   Final next level of care: Home/Self Care Barriers to Discharge: Continued Medical Work up   Patient Goals and CMS Choice   Choice offered to / list presented to : NA  Discharge Placement                         Discharge Plan and Services Additional resources added to the After Visit Summary for   In-house Referral: NA Discharge Planning Services: CM Consult Post Acute Care Choice: NA          DME Arranged: N/A DME Agency: NA       HH Arranged: NA          Social Determinants of Health (SDOH) Interventions SDOH Screenings   Tobacco Use: Unknown (02/25/2023)     Readmission Risk Interventions     No data to display

## 2023-02-27 NOTE — Progress Notes (Signed)
ANTICOAGULATION CONSULT NOTE - Follow up Consult  Pharmacy Consult for heparin Indication: multivessel CAD  Allergies  Allergen Reactions   Lisinopril Other (See Comments)    Caused trouble urinating and soreness in the lower abdomen   Pravastatin Other (See Comments)    Leg cramps   Simvastatin Other (See Comments)    Welts, fever, headaches   Ciprofloxacin Hives and Rash    Patient Measurements: Height: 5\' 5"  (165.1 cm) Weight: 67.4 kg (148 lb 9.6 oz) IBW/kg (Calculated) : 61.5 Heparin Dosing Weight: 73.6 kg  Vital Signs: Temp: 98.2 F (36.8 C) (07/10 2011) Temp Source: Oral (07/11 0412) BP: 114/63 (07/11 0412) Pulse Rate: 66 (07/11 0042)  Labs: Recent Labs    02/25/23 0047 02/25/23 0742 02/26/23 0138 02/27/23 0118  HGB 12.2*  --  12.0* 12.9*  HCT 36.4*  --  36.4* 36.9*  PLT 190  --  216 170  HEPARINUNFRC 0.21* 0.36 0.37 0.23*  CREATININE 1.49*  --  1.61* 1.51*    Estimated Creatinine Clearance: 31.7 mL/min (A) (by C-G formula based on SCr of 1.51 mg/dL (H)).   Medical History: Past Medical History:  Diagnosis Date   Arthritis    Constipation    Depression    Enlarged prostate    Environmental allergies    GERD (gastroesophageal reflux disease)    Hypertension      Assessment: 87 year old male presented with chest pain and shortness of breath. Chest pain associated with exertion, goes away with rest. Cardiology consulted - recommend starting on heparin. No anticoagulation noted PTA. Pharmacy consulted to manage heparin infusion.  S/p cath which showed severe multivessel disease. TCTS consulted.   Heparin restarted  post cath 02/24/23 2 hours post TR band removal.   7/10  heparin level  is 0.37, remains therapeutic on heparin infusion at 1050 units/hour.  CBC stable with Hgb 11s-12s, pltc wnl. No signs/symptoms of bleeding reported.  TCTS has seen patient, awaiting Dr. Cliffton Asters plan.  Heparin level returned at 0.23 sub-therapeutic. Per RN no issues  with heparin infusion running and drawn correctly. Plt drop from 216>170, Hgb 12.9.  Goal of Therapy:  Heparin level 0.3-0.7 units/ml Monitor platelets by anticoagulation protocol: Yes   Plan:  Increase heparin infusion to 1150 units/h Heparin level in 8 hours Heparin level and CBC daily Monitor for signs and symptoms of bleeding TCTS has seen patient, awaiting Dr. Cliffton Asters plan.  Arabella Merles, PharmD. Clinical Pharmacist 02/27/2023 4:19 AM

## 2023-03-06 ENCOUNTER — Ambulatory Visit (HOSPITAL_COMMUNITY)
Admission: RE | Admit: 2023-03-06 | Discharge: 2023-03-06 | Disposition: A | Discharge status: Home / Self Care | Payer: Medicare Other | Source: Ambulatory Visit | Attending: Adult Health | Admitting: Adult Health

## 2023-03-06 ENCOUNTER — Encounter (HOSPITAL_COMMUNITY): Payer: Self-pay

## 2023-03-06 VITALS — BP 138/82 | HR 66 | Wt 149.6 lb

## 2023-03-06 DIAGNOSIS — Z7982 Long term (current) use of aspirin: Secondary | ICD-10-CM | POA: Insufficient documentation

## 2023-03-06 DIAGNOSIS — R197 Diarrhea, unspecified: Secondary | ICD-10-CM | POA: Insufficient documentation

## 2023-03-06 DIAGNOSIS — I214 Non-ST elevation (NSTEMI) myocardial infarction: Secondary | ICD-10-CM | POA: Diagnosis not present

## 2023-03-06 DIAGNOSIS — I5022 Chronic systolic (congestive) heart failure: Secondary | ICD-10-CM | POA: Diagnosis not present

## 2023-03-06 DIAGNOSIS — Z79899 Other long term (current) drug therapy: Secondary | ICD-10-CM | POA: Insufficient documentation

## 2023-03-06 DIAGNOSIS — I2510 Atherosclerotic heart disease of native coronary artery without angina pectoris: Secondary | ICD-10-CM | POA: Diagnosis not present

## 2023-03-06 DIAGNOSIS — N1831 Chronic kidney disease, stage 3a: Secondary | ICD-10-CM | POA: Insufficient documentation

## 2023-03-06 DIAGNOSIS — Z7902 Long term (current) use of antithrombotics/antiplatelets: Secondary | ICD-10-CM | POA: Diagnosis not present

## 2023-03-06 DIAGNOSIS — I255 Ischemic cardiomyopathy: Secondary | ICD-10-CM | POA: Insufficient documentation

## 2023-03-06 DIAGNOSIS — I130 Hypertensive heart and chronic kidney disease with heart failure and stage 1 through stage 4 chronic kidney disease, or unspecified chronic kidney disease: Secondary | ICD-10-CM | POA: Diagnosis not present

## 2023-03-06 DIAGNOSIS — M549 Dorsalgia, unspecified: Secondary | ICD-10-CM | POA: Diagnosis not present

## 2023-03-06 LAB — BASIC METABOLIC PANEL
Anion gap: 8 (ref 5–15)
BUN: 19 mg/dL (ref 8–23)
CO2: 24 mmol/L (ref 22–32)
Calcium: 9.3 mg/dL (ref 8.9–10.3)
Chloride: 106 mmol/L (ref 98–111)
Creatinine, Ser: 1.61 mg/dL — ABNORMAL HIGH (ref 0.61–1.24)
GFR, Estimated: 42 mL/min — ABNORMAL LOW (ref >60)
Glucose, Bld: 104 mg/dL — ABNORMAL HIGH (ref 70–99)
Potassium: 4.2 mmol/L (ref 3.5–5.1)
Sodium: 138 mmol/L (ref 135–145)

## 2023-03-06 NOTE — Progress Notes (Signed)
HEART & VASCULAR TRANSITION OF CARE CONSULT NOTE     Referring Physician: Dr Jerral Ralph Primary Care: Dr Nehemiah Settle  Primary Cardiologist:Dr Hochrein.   HPI: Referred to clinic by Dr Jerral Ralph for heart failure consultation.   Calvin Marks is a 87 year old with a history of HTN,  BPN, depression, arthritis, CAD, and HFrEF.   Admitted Cardiology consulted. LHC with multivessel disease. CT surgery consulted. Calvin Marks did not want to pursue CABG due to age, family support, and concern for SNF after discharge. Started some GDTM with farxiga. Also placed on statin and plavix. Discharged 02/27/23 .   Overall feeling ok but having diarrhea for the last 12 days. Having loose stool twice a day. Previously having Minor chest soreness that he rarely notices. Limited by back pain. Denies SOB. Walking in the house.  Able to take his dog outside and walk to the mailbox.Denies PND/Orthopnea. Appetite ok. No fever or chills. Weight at home 145-148  pounds. Taking all medications.    Cardiac Testing  LHC 02/24/2023  Prox RCA lesion is 70% stenosed.   RPDA lesion is 100% stenosed.   Ost LM lesion is 40% stenosed.   Ost LAD to Prox LAD lesion is 90% stenosed.   Mid LAD lesion is 95% stenosed.   1st Diag-1 lesion is 95% stenosed.   1st Diag-2 lesion is 90% stenosed.   Ost Cx to Prox Cx lesion is 80% stenosed.   Prox Cx to Mid Cx lesion is 95% stenosed.  1.  Severe multivessel disease. 2.  LVEDP of 17 mmHg.   Echo 02/2023 1. Akinesis of the inferolateral wall and basal inferior wall; overall  moderate LV dysfunction.   2. Left ventricular ejection fraction, by estimation, is 35 to 40%. The  left ventricle has moderately decreased function. The left ventricle  demonstrates regional wall motion abnormalities (see scoring  diagram/findings for description). Left ventricular   diastolic parameters are consistent with Grade I diastolic dysfunction  (impaired relaxation).   3. Right ventricular systolic function  is normal. The right ventricular  size is normal.   4. The mitral valve is normal in structure. Moderate mitral valve  regurgitation. No evidence of mitral stenosis.   5. The aortic valve is tricuspid. Aortic valve regurgitation is trivial.   Review of Systems: [y] = yes, [ ]  = no   General: Weight gain [ ] ; Weight loss [ ] ; Anorexia [ ] ; Fatigue [ ] ; Fever [ ] ; Chills [ ] ; Weakness [ ]   Cardiac: Chest pain/pressure [ ] ; Resting SOB [ ] ; Exertional SOB [ ] ; Orthopnea [ ] ; Pedal Edema [ ] ; Palpitations [ ] ; Syncope [ ] ; Presyncope [ ] ; Paroxysmal nocturnal dyspnea[ ]   Pulmonary: Cough [ ] ; Wheezing[ ] ; Hemoptysis[ ] ; Sputum [ ] ; Snoring [ ]   GI: Vomiting[ ] ; Dysphagia[ ] ; Melena[ ] ; Hematochezia [ ] ; Heartburn[ ] ; Abdominal pain [ ] ; Constipation [ ] ; Diarrhea [ ] ; BRBPR [ ]   GU: Hematuria[ ] ; Dysuria [ ] ; Nocturia[ ]   Vascular: Pain in legs with walking [ ] ; Pain in feet with lying flat [ ] ; Non-healing sores [ ] ; Stroke [ ] ; TIA [ ] ; Slurred speech [ ] ;  Neuro: Headaches[ ] ; Vertigo[ ] ; Seizures[ ] ; Paresthesias[ ] ;Blurred vision [ ] ; Diplopia [ ] ; Vision changes [ ]   Ortho/Skin: Arthritis [Y ]; Joint pain [ Y]; Muscle pain [ ] ; Joint swelling [ ] ; Back Pain [Y ]; Rash [ ]   Psych: Depression[Y ]; Anxiety[ ]   Heme: Bleeding problems [ ] ;  Clotting disorders [ ] ; Anemia [ ]   Endocrine: Diabetes [ ] ; Thyroid dysfunction[ ]    Past Medical History:  Diagnosis Date   Arthritis    Constipation    Depression    Enlarged prostate    Environmental allergies    GERD (gastroesophageal reflux disease)    Hypertension     Current Outpatient Medications  Medication Sig Dispense Refill   aspirin 81 MG chewable tablet Chew 1 tablet (81 mg total) by mouth daily. 90 tablet 3   clopidogrel (PLAVIX) 75 MG tablet Take 1 tablet (75 mg total) by mouth daily. 30 tablet 11   dapagliflozin propanediol (FARXIGA) 10 MG TABS tablet Take 1 tablet (10 mg total) by mouth daily. 30 tablet 0   finasteride  (PROSCAR) 5 MG tablet Take 5 mg by mouth every morning.     fish oil-omega-3 fatty acids 1000 MG capsule Take 1 g by mouth every morning.     HYDROcodone-acetaminophen (NORCO) 7.5-325 MG tablet Take 1 tablet by mouth 4 (four) times daily.     isosorbide mononitrate (IMDUR) 30 MG 24 hr tablet Take 1 tablet (30 mg total) by mouth daily. 30 tablet 0   magnesium oxide (MAG-OX) 400 (240 Mg) MG tablet Take 400 mg by mouth at bedtime.     Melatonin 10 MG TABS Take 10 mg by mouth at bedtime.     metoprolol succinate (TOPROL-XL) 25 MG 24 hr tablet Take 1 tablet (25 mg total) by mouth daily. 30 tablet 0   pantoprazole (PROTONIX) 20 MG tablet Take 1 tablet (20 mg total) by mouth daily. 30 tablet 0   polyethylene glycol (MIRALAX / GLYCOLAX) packet Take 8.5 g by mouth in the morning.     rosuvastatin (CRESTOR) 20 MG tablet Take 1 tablet (20 mg total) by mouth daily. 30 tablet 0   traZODone (DESYREL) 100 MG tablet Take 100 mg by mouth at bedtime.     No current facility-administered medications for this encounter.    Allergies  Allergen Reactions   Lisinopril Other (See Comments)    Caused trouble urinating and soreness in the lower abdomen   Pravastatin Other (See Comments)    Leg cramps   Simvastatin Other (See Comments)    Welts, fever, headaches   Ciprofloxacin Hives and Rash      Social History   Socioeconomic History   Marital status: Married    Spouse name: Not on file   Number of children: Not on file   Years of education: Not on file   Highest education level: Not on file  Occupational History   Not on file  Tobacco Use   Smoking status: Never   Smokeless tobacco: Not on file  Substance and Sexual Activity   Alcohol use: Yes    Comment: socially   Drug use: No   Sexual activity: Not on file  Other Topics Concern   Not on file  Social History Narrative   Not on file   Social Determinants of Health   Financial Resource Strain: Not on file  Food Insecurity: Not on file   Transportation Needs: Not on file  Physical Activity: Not on file  Stress: Not on file  Social Connections: Not on file  Intimate Partner Violence: Not on file     No family history on file.  Vitals:   03/06/23 1153  BP: 138/82  Pulse: 66  SpO2: 97%  Weight: 67.9 kg (149 lb 9.6 oz)   Wt Readings from Last 3 Encounters:  03/06/23 67.9 kg (149 lb 9.6 oz)  02/27/23 67.4 kg (148 lb 9.6 oz)  12/13/13 74.6 kg (164 lb 6.4 oz)     PHYSICAL EXAM: General:  Well appearing. No respiratory difficulty HEENT: normal Neck: supple. no JVD. Carotids 2+ bilat; no bruits. No lymphadenopathy or thryomegaly appreciated. Cor: PMI nondisplaced. Regular rate & rhythm. No rubs, gallops or murmurs. Lungs: clear Abdomen: soft, nontender, nondistended. No hepatosplenomegaly. No bruits or masses. Good bowel sounds. Extremities: no cyanosis, clubbing, rash, edema Neuro: alert & oriented x 3, cranial nerves grossly intact. moves all 4 extremities w/o difficulty. Affect pleasant.    ASSESSMENT & PLAN: 1. Chronic HFrEF, ICM  Echo EF 35-40% which newly reduced from normal EF in 2018. Multivessel coronary disease. He declined CABG.  NYHA II.  GDMT  Diuretic- Volume status stable. Does not need loop diuretics.  BB-Continue toprol Xl 25 mg daily  Ace/ARB/ARNI- NO CKD  MRA- No CKD. SGLT2i- Stop farxiga. Concerned this maybe causing diarrhea.  GDMT limited by CKD.   2. CAD LHC multivessel CAD. CT surgery consulted. Calvin Marks did not want to pursue CABG On asa+plavix+ crestor + imdur.  - No bleeding issues.  No chest pain.   3. HTN  Stable.  4. Diarrhea  It is possible this is r/t medication. Stop magnesium and farxiga.  He has follow up with PCP next week.   5. CKD Stage IIIa Creatinine baseline 1.4-1.6  Check BMET now.   Referred to HFSW (PCP, Medications, Transportation, ETOH Abuse, Drug Abuse, Insurance, Financial ):  No Refer to Pharmacy:  No Refer to Home Health:  No Refer to  Advanced Heart Failure Clinic:  no  Refer to General Cardiology: Back to Hochrein  Follow up as needed.   Natanael Saladin NP-C  1:02 PM

## 2023-03-06 NOTE — Patient Instructions (Signed)
Good to see you today!   STOP Farxiga  STOP Magnesium  Can try Immodium as needed  Labs done today, your results will be available in MyChart, we will contact you for abnormal readings.  Follow up with [place] as needed  Follow up with Dr. Antoine Poche as scheduled   Do the following things EVERYDAY: Weigh yourself in the morning before breakfast. Write it down and keep it in a log. Take your medicines as prescribed Eat low salt foods--Limit salt (sodium) to 2000 mg per day.  Stay as active as you can everyday Limit all fluids for the day to less than 2 liters

## 2023-03-25 ENCOUNTER — Other Ambulatory Visit (HOSPITAL_COMMUNITY): Payer: Self-pay

## 2023-03-26 ENCOUNTER — Other Ambulatory Visit: Payer: Self-pay

## 2023-03-26 ENCOUNTER — Other Ambulatory Visit (HOSPITAL_COMMUNITY): Payer: Self-pay

## 2023-03-27 ENCOUNTER — Other Ambulatory Visit (HOSPITAL_COMMUNITY): Payer: Self-pay

## 2023-04-02 NOTE — Progress Notes (Unsigned)
  Cardiology Office Note:   Date:  04/03/2023  ID:  Calvin Marks, DOB December 12, 1935, MRN [id number] PCP: Calvin Dills, MD  [facility] Providers Cardiologist:  Rollene Rotunda, MD {   History of Present Illness:   Calvin Marks is a 87 y.o. male with NSTEMI and CAD.  He opted for medical management.  He has an ejection fraction of 35%.  He was seen once in the Heart Failure Clinic and he was having some diarrhea.  He was taking longstanding magnesium which was discontinued.  He was also taken off of his Marcelline Deist to see if it was contributing to his diarrhea.  He get this under control.  Remarkably he has done well.  He lives with his wife.  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  He has a [place].  He is limited by back pain more than anything else.  He had a little nervous tension in his epigastric area.  He has not had substernal chest pressure for which he has needed nitroglycerin.  He is not describing PND or orthopnea.  He is not having any palpitations, presyncope or syncope..  ROS: As stated in the HPI and negative for all other systems.  Studies Reviewed:    EKG:    NA  Risk Assessment/Calculations:              Physical Exam:   VS:  BP 124/86   Pulse 64   Ht 5' 6.5" (1.689 m)   Wt 151 lb 9.6 oz (68.8 kg)   SpO2 94%   BMI 24.10 kg/m    Wt Readings from Last 3 Encounters:  04/03/23 151 lb 9.6 oz (68.8 kg)  03/06/23 149 lb 9.6 oz (67.9 kg)  02/27/23 148 lb 9.6 oz (67.4 kg)     GEN: Well nourished, well developed in no acute distress NECK: No JVD; No carotid bruits CARDIAC: RRR, no murmurs, rubs, gallops RESPIRATORY:  Clear to auscultation without rales, wheezing or rhonchi  ABDOMEN: Soft, non-tender, non-distended EXTREMITIES:  No edema; No deformity   ASSESSMENT AND PLAN:   CAD:  The patient has no new sypmtoms.  No further cardiovascular testing is indicated.  We will continue with aggressive risk reduction and meds as  listed.    Acute systolic HF: I will try to restart the [place].  If he gets diarrhea again we will list this as an allergy.  Otherwise his meds will remain as listed.  I am avoiding ARB/ARNI secondary to his renal insufficiency.  He had some bradycardia so I will not start a beta-blocker.  I might titrate his Imdur going forward.  Dyslipidemia: LDL was 101 in the hospital.  I will repeat this when he comes back.  CKD IIIB: Last creatinine was 1.61.  No change in therapy.   Follow up with me in a couple of months.  Signed, Rollene Rotunda, MD

## 2023-04-03 ENCOUNTER — Ambulatory Visit: Payer: Medicare Other | Attending: Cardiology | Admitting: Cardiology

## 2023-04-03 ENCOUNTER — Encounter: Payer: Self-pay | Admitting: Cardiology

## 2023-04-03 VITALS — BP 124/86 | HR 64 | Ht 66.5 in | Wt 151.6 lb

## 2023-04-03 DIAGNOSIS — E785 Hyperlipidemia, unspecified: Secondary | ICD-10-CM | POA: Diagnosis not present

## 2023-04-03 DIAGNOSIS — N1832 Chronic kidney disease, stage 3b: Secondary | ICD-10-CM | POA: Diagnosis not present

## 2023-04-03 DIAGNOSIS — I5021 Acute systolic (congestive) heart failure: Secondary | ICD-10-CM

## 2023-04-03 DIAGNOSIS — I2510 Atherosclerotic heart disease of native coronary artery without angina pectoris: Secondary | ICD-10-CM

## 2023-04-03 MED ORDER — DAPAGLIFLOZIN PROPANEDIOL 10 MG PO TABS: 10.0000 mg | ORAL_TABLET | Freq: Every day | ORAL | 3 refills | Status: DC

## 2023-04-03 NOTE — Patient Instructions (Signed)
Medication Instructions:  Your physician has recommended you make the following change in your medication:  - Farxiga, 10mg , once daily *If you need a refill on your cardiac medications before your next appointment, please call your pharmacy*   Follow-Up: At [facility], you and your health needs are our priority.  As part of our continuing mission to provide you with exceptional heart care, we have created designated Provider Care Teams.  These Care Teams include your primary Cardiologist (physician) and Advanced Practice Providers (APPs -  Physician Assistants and Nurse Practitioners) who all work together to provide you with the care you need, when you need it.  We recommend signing up for the patient portal called "MyChart".  Sign up information is provided on this After Visit Summary.  MyChart is used to connect with patients for Virtual Visits (Telemedicine).  Patients are able to view lab/test results, encounter notes, upcoming appointments, etc.  Non-urgent messages can be sent to your provider as well.   To learn more about what you can do with MyChart, go to [email or web address].    Your next appointment:   2 month(s)  Provider:   Rollene Rotunda, MD

## 2023-06-17 DIAGNOSIS — I2510 Atherosclerotic heart disease of native coronary artery without angina pectoris: Secondary | ICD-10-CM | POA: Insufficient documentation

## 2023-06-17 DIAGNOSIS — E785 Hyperlipidemia, unspecified: Secondary | ICD-10-CM | POA: Insufficient documentation

## 2023-06-17 DIAGNOSIS — N1832 Chronic kidney disease, stage 3b: Secondary | ICD-10-CM | POA: Insufficient documentation

## 2023-06-20 ENCOUNTER — Encounter: Payer: Self-pay | Admitting: Cardiology

## 2023-06-20 ENCOUNTER — Ambulatory Visit: Payer: Medicare Other | Attending: Cardiology | Admitting: Cardiology

## 2023-06-20 VITALS — BP 148/98 | HR 64 | Ht 66.0 in | Wt 150.2 lb

## 2023-06-20 DIAGNOSIS — E785 Hyperlipidemia, unspecified: Secondary | ICD-10-CM

## 2023-06-20 DIAGNOSIS — I5021 Acute systolic (congestive) heart failure: Secondary | ICD-10-CM

## 2023-06-20 DIAGNOSIS — I10 Essential (primary) hypertension: Secondary | ICD-10-CM

## 2023-06-20 DIAGNOSIS — N1832 Chronic kidney disease, stage 3b: Secondary | ICD-10-CM

## 2023-06-20 DIAGNOSIS — I2510 Atherosclerotic heart disease of native coronary artery without angina pectoris: Secondary | ICD-10-CM | POA: Diagnosis present

## 2023-06-20 MED ORDER — ISOSORBIDE MONONITRATE ER 60 MG PO TB24
30.0000 mg | ORAL_TABLET | Freq: Every day | ORAL | 0 refills | Status: DC
Start: 2023-06-20 — End: 2023-06-20

## 2023-06-20 MED ORDER — DAPAGLIFLOZIN PROPANEDIOL 10 MG PO TABS: 10.0000 mg | ORAL_TABLET | Freq: Every day | ORAL | 3 refills | Status: DC

## 2023-06-20 MED ORDER — METOPROLOL SUCCINATE ER 25 MG PO TB24: 25.0000 mg | ORAL_TABLET | Freq: Every day | ORAL | 3 refills | Status: DC

## 2023-06-20 MED ORDER — ROSUVASTATIN CALCIUM 20 MG PO TABS: 20.0000 mg | ORAL_TABLET | Freq: Every day | ORAL | 3 refills | Status: DC

## 2023-06-20 MED ORDER — HYDRALAZINE HCL 10 MG PO TABS
10.0000 mg | ORAL_TABLET | Freq: Two times a day (BID) | ORAL | 3 refills | Status: DC
Start: 2023-06-20 — End: 2024-06-08

## 2023-06-20 MED ORDER — PANTOPRAZOLE SODIUM 20 MG PO TBEC: 20.0000 mg | DELAYED_RELEASE_TABLET | Freq: Every day | ORAL | 3 refills | Status: DC

## 2023-06-20 MED ORDER — ISOSORBIDE MONONITRATE ER 60 MG PO TB24
60.0000 mg | ORAL_TABLET | Freq: Every day | ORAL | 3 refills | Status: DC
Start: 2023-06-20 — End: 2023-07-15

## 2023-06-20 MED ORDER — CLOPIDOGREL BISULFATE 75 MG PO TABS: 75.0000 mg | ORAL_TABLET | Freq: Every day | ORAL | 3 refills | Status: DC

## 2023-06-20 NOTE — Patient Instructions (Signed)
Medication Instructions:  isosorbide mononitrate (IMDUR) 60 MG 24 hr tablet and Hydralazine 10 mg twice per day. New scripts sent. *If you need a refill on your cardiac medications before your next appointment, please call your pharmacy*   Lab Work: FASTING LIPID PRIOR TO NEXT SCHEDULED APPOINTMENT IN THE MORNING. If you have labs (blood work) drawn today and your tests are completely normal, you will receive your results only by: MyChart Message (if you have MyChart) OR A paper copy in the mail If you have any lab test that is abnormal or we need to change your treatment, we will call you to review the results.   Follow-Up: At [facility], you and your health needs are our priority.  As part of our continuing mission to provide you with exceptional heart care, we have created designated Provider Care Teams.  These Care Teams include your primary Cardiologist (physician) and Advanced Practice Providers (APPs -  Physician Assistants and Nurse Practitioners) who all work together to provide you with the care you need, when you need it.  We recommend signing up for the patient portal called "MyChart".  Sign up information is provided on this After Visit Summary.  MyChart is used to connect with patients for Virtual Visits (Telemedicine).  Patients are able to view lab/test results, encounter notes, upcoming appointments, etc.  Non-urgent messages can be sent to your provider as well.   To learn more about what you can do with MyChart, go to [email or web address].    Your next appointment:   2 month(s)  Provider:   Any APP. Morning appointment after lab have been completed.   Other Instructions Fasting blood work to be done in morning of appointment.

## 2023-07-14 ENCOUNTER — Other Ambulatory Visit: Payer: Self-pay | Admitting: Cardiology

## 2023-07-14 DIAGNOSIS — I5021 Acute systolic (congestive) heart failure: Secondary | ICD-10-CM

## 2023-07-14 DIAGNOSIS — I10 Essential (primary) hypertension: Secondary | ICD-10-CM

## 2023-07-21 ENCOUNTER — Telehealth: Payer: Self-pay | Admitting: Cardiology

## 2023-07-21 NOTE — Telephone Encounter (Signed)
Pt c/o medication issue:  1. Name of Medication:   isosorbide mononitrate (IMDUR) 60 MG 24 hr tablet    2. How are you currently taking this medication (dosage and times per day)? 1 tablet daily   3. Are you having a reaction (difficulty breathing--STAT)? No   4. What is your medication issue? Prescription advises to take 1/2 tablet when he is taking a whole per what Dr. Antoine Poche advised him at his last appt. Please advise.

## 2023-07-21 NOTE — Telephone Encounter (Signed)
Called and spoke to patient. Patient stated at his appointment 11/1 his Isosorbide was increased from 30 mg to 60 mg. Prescriptions sent in 11/27 for Isosorbide 60 mg take 1/2 tablet. Patient would like new prescription sent to pharmacy to say take 1 (60 mg ) tablet daily. Called [facility] pharmacy to cancel 1/2 tablet prescription and prescription for 60 mg 1 tablet daily called in.

## 2023-08-28 ENCOUNTER — Encounter: Payer: Self-pay | Admitting: Nurse Practitioner

## 2023-08-28 ENCOUNTER — Ambulatory Visit: Payer: Medicare Other | Attending: Nurse Practitioner | Admitting: Emergency Medicine

## 2023-08-28 VITALS — BP 154/76 | HR 68 | Ht 67.0 in | Wt 157.0 lb

## 2023-08-28 DIAGNOSIS — N1832 Chronic kidney disease, stage 3b: Secondary | ICD-10-CM | POA: Diagnosis present

## 2023-08-28 DIAGNOSIS — I5020 Unspecified systolic (congestive) heart failure: Secondary | ICD-10-CM | POA: Insufficient documentation

## 2023-08-28 DIAGNOSIS — I2510 Atherosclerotic heart disease of native coronary artery without angina pectoris: Secondary | ICD-10-CM | POA: Insufficient documentation

## 2023-08-28 DIAGNOSIS — I255 Ischemic cardiomyopathy: Secondary | ICD-10-CM | POA: Diagnosis present

## 2023-08-28 DIAGNOSIS — Z79899 Other long term (current) drug therapy: Secondary | ICD-10-CM | POA: Insufficient documentation

## 2023-08-28 DIAGNOSIS — I10 Essential (primary) hypertension: Secondary | ICD-10-CM | POA: Insufficient documentation

## 2023-08-28 DIAGNOSIS — E785 Hyperlipidemia, unspecified: Secondary | ICD-10-CM | POA: Diagnosis present

## 2023-08-28 MED ORDER — AMLODIPINE BESYLATE 2.5 MG PO TABS: 2.5000 mg | ORAL_TABLET | Freq: Every day | ORAL | 3 refills | Status: AC

## 2023-08-28 NOTE — Progress Notes (Signed)
 Cardiology Office Note:    Date:  08/28/2023  ID:  Calvin Marks, DOB 03-Jan-1936, MRN [id number] PCP: Rexanne Ingle, MD  [facility] Providers Cardiologist:  Lynwood Schilling, MD       Patient Profile:     Calvin Marks is a 88 year old male with visit pertinent history of HTN, CAD, HFrEF  He was admitted to the hospital on 02/21/2023 for chest pain.  Heart catheterization performed on 02/24/2023 showing severe three-vessel CAD and CT surgery was consulted for CABG.  Patient opted for medical management rather than high risk CABG.  Echocardiogram showed newly reduced EF of 35-40% with akinesis of inferolateral wall and basal inferior wall, grade 1 DD, RV SF normal, moderate mitral valve regurgitation.  He was started on Farxiga , statin, Plavix  and discharged on 02/27/2023.  He was seen by heart failure clinic on 03/06/2023 where he was doing well at the time.  He was not started on ACE/ARB/Arni or MRA due to CKD.  Seen by Dr. Schilling on 04/03/2023 where he was doing remarkably well.  Again seen by Dr. Schilling on 06/20/2023 where he noted to have some increased blood pressures.  He was started on hydralazine  10 mg twice daily.  His isosorbide  was increased to 60 mg daily.  Increasing his beta-blocker was avoided due to bradycardia.      History of Present Illness:  Discussed the use of AI scribe software for clinical note transcription with the patient, who gave verbal consent to proceed.  Calvin Marks is a 88 y.o. male who returns for 2 month f/u for HTN, CAD.   The patient reports feeling generally well, with no chest pain or other cardiac symptoms. . The patient is active, despite some physical limitations due to previous back operations and arthritis. He walks to his mailbox, plays catch with his dog, and recently charged his car battery. He has no chest pain during any of his activities. He reports that his blood pressure has been high, and he has been feeling some symptoms  related to this, including headaches. However, his headaches have resolved since a medication change adding Hydaralzine and increasing imdur  at last visit. The patient has not been monitoring his blood pressure at home but agrees to start doing so. He has no leg swelling or SOB and has vastly decreased the sodium in his diet.       Review of Systems  Constitutional: Negative for weight gain and weight loss.  Cardiovascular:  Negative for chest pain, claudication, cyanosis, dyspnea on exertion, irregular heartbeat, leg swelling, near-syncope, orthopnea, palpitations, paroxysmal nocturnal dyspnea and syncope.  Respiratory:  Negative for cough, hemoptysis and shortness of breath.   Gastrointestinal:  Negative for abdominal pain, hematochezia and melena.  Genitourinary:  Negative for hematuria.  Neurological:  Negative for dizziness, headaches and light-headedness.     See HPI     Studies Reviewed:       Cardiac Catheterization 02/24/2023   Prox RCA lesion is 70% stenosed.   RPDA lesion is 100% stenosed.   Ost LM lesion is 40% stenosed.   Ost LAD to Prox LAD lesion is 90% stenosed.   Mid LAD lesion is 95% stenosed.   1st Diag-1 lesion is 95% stenosed.   1st Diag-2 lesion is 90% stenosed.   Ost Cx to Prox Cx lesion is 80% stenosed.   Prox Cx to Mid Cx lesion is 95% stenosed.   1.  Severe multivessel disease. 2.  LVEDP of 17 mmHg. Diagnostic Dominance:  Right  Echocardiogram 02/22/2023 1. Akinesis of the inferolateral wall and basal inferior wall; overall  moderate LV dysfunction.   2. Left ventricular ejection fraction, by estimation, is 35 to 40%. The  left ventricle has moderately decreased function. The left ventricle  demonstrates regional wall motion abnormalities (see scoring  diagram/findings for description). Left ventricular   diastolic parameters are consistent with Grade I diastolic dysfunction  (impaired relaxation).   3. Right ventricular systolic function is normal. The  right ventricular  size is normal.   4. The mitral valve is normal in structure. Moderate mitral valve  regurgitation. No evidence of mitral stenosis.   5. The aortic valve is tricuspid. Aortic valve regurgitation is trivial.  Aortic valve sclerosis is present, with no evidence of aortic valve  stenosis.   6. The inferior vena cava is normal in size with greater than 50%  respiratory variability, suggesting right atrial pressure of 3 mmHg.    Risk Assessment/Calculations:     HYPERTENSION CONTROL Vitals:   08/28/23 1113 08/28/23 1155  BP: (!) 156/82 (!) 154/76    The patient's blood pressure is elevated above target today.  In order to address the patient's elevated BP: A new medication was prescribed today.          Physical Exam:   VS:  BP (!) 154/76 (BP Location: Left Arm, Patient Position: Sitting, Cuff Size: Normal)   Pulse 68   Ht 5' 7 (1.702 m)   Wt 157 lb (71.2 kg)   SpO2 95%   BMI 24.59 kg/m    Wt Readings from Last 3 Encounters:  08/28/23 157 lb (71.2 kg)  06/20/23 150 lb 3.2 oz (68.1 kg)  04/03/23 151 lb 9.6 oz (68.8 kg)    Constitutional:      Appearance: Normal and healthy appearance.  Neck:     Vascular: JVD normal.  Pulmonary:     Effort: Pulmonary effort is normal.     Breath sounds: Normal breath sounds.  Chest:     Chest wall: Not tender to palpatation.  Cardiovascular:     PMI at left midclavicular line. Normal rate. Regular rhythm. Normal S1. Normal S2.      Murmurs: There is a grade 3/6 systolic murmur.     No gallop.  No click. No rub.     Comments: Right radial cath site without discoloartion, bleeding, hematoma, bruit  Pulses:    Intact distal pulses.  Edema:    Peripheral edema absent.  Musculoskeletal: Normal range of motion.     Cervical back: Normal range of motion and neck supple. Skin:    General: Skin is warm and dry.  Neurological:     General: No focal deficit present.     Mental Status: Alert and oriented to person, place  and time.  Psychiatric:        Behavior: Behavior is cooperative.        Assessment and Plan:  Coronary artery disease  -Cardiac catheterization 02/2023 showed severe 3-vessel CAD not amenable to PCI. CT surgery consulted and pt decided to not purse CABG and CT surgery thought this was too high risk. He continues to be very active. He is stable with no anginal symptoms, no indication for ischemic evaluation at this time. Continue Aspirin  81mg  daily, Plavix  75mg  daily, imdur  60mg  daily, Metoprolol -XL 25mg  daily, Rosuvastatin  20mg  daily    Chronic HFrEF / Ischemic cardiomyopathy -Hs is euvolemic and well-compensated. NYHA class II -Echo 02/2023 with EF 35-40% which was newly reduced  from prior normal EF in 2018 Multivessel CAD and CABG deferred as noted above. GDMT limited by CKD (GFR 42, creatinine 1.61 on 02/2023), so avoiding Ace/ARB/ARNI/MRA. Does not require loop diuretic at this time.  -Conitnue GDMT Farixga 10mg  daily, Imdur  60mg  daily, Metoprolol -XL 25mg  daily, Hydaralizine 10mg  daily   Hypertension  -BP today 156/82 and repeat 154/76, not currently under good control. Was started on Hydralazine  10mg  BID and increased Imdur  to 60mg  on last visit. He has not been taking BP at home. He was transitioned off of Amlodipine  2.5mg  in 02/2023 after NSTEMI to make room for BB therapy. Will add Amlodipine  2.5mg  back to his regimen today. Will bring him back in 4-6 weeks and if BP remains elevated will increased Hydralazine  to 20mg  BID at that time. He will complete daily BP log and bring into office during next visit.   CKD stage 3b -GFR 42 and creatinine 1.61 on 02/2023. Plan to repeat BMET today   Hyperlipidemia -LDL 101 in 02/2023. Will have fasting lipid panel today. Encouraged him to follow heart healthy diet plan. Continue Rosuvastatin  20mg  daily.              Dispo:  Return in about 6 weeks (around 10/09/2023).  Signed, Lum LITTIE Louis, NP

## 2023-08-28 NOTE — Patient Instructions (Signed)
 Medication Instructions:  Start Amlodipine  2.5 mg daily  *If you need a refill on your cardiac medications before your next appointment, please call your pharmacy*   Lab Work: BMET today Lipid Panel today (previously ordered)   Testing/Procedures: NONE ordered at this time of appointment    Follow-Up: At [facility], you and your health needs are our priority.  As part of our continuing mission to provide you with exceptional heart care, we have created designated Provider Care Teams.  These Care Teams include your primary Cardiologist (physician) and Advanced Practice Providers (APPs -  Physician Assistants and Nurse Practitioners) who all work together to provide you with the care you need, when you need it.  We recommend signing up for the patient portal called MyChart.  Sign up information is provided on this After Visit Summary.  MyChart is used to connect with patients for Virtual Visits (Telemedicine).  Patients are able to view lab/test results, encounter notes, upcoming appointments, etc.  Non-urgent messages can be sent to your provider as well.   To learn more about what you can do with MyChart, go to [email or web address].    Your next appointment:   6 week(s)  Provider:   Damien Braver, NP        Other Instructions

## 2023-08-29 ENCOUNTER — Telehealth: Payer: Self-pay

## 2023-08-29 LAB — BASIC METABOLIC PANEL
BUN/Creatinine Ratio: 12 (ref 10–24)
BUN: 17 mg/dL (ref 8–27)
CO2: 22 mmol/L (ref 20–29)
Calcium: 9.7 mg/dL (ref 8.6–10.2)
Chloride: 104 mmol/L (ref 96–106)
Creatinine, Ser: 1.42 mg/dL — ABNORMAL HIGH (ref 0.76–1.27)
Glucose: 95 mg/dL (ref 70–99)
Potassium: 4.6 mmol/L (ref 3.5–5.2)
Sodium: 143 mmol/L (ref 134–144)
eGFR: 48 mL/min/{1.73_m2} — ABNORMAL LOW (ref >59)

## 2023-08-29 LAB — LIPID PANEL
Chol/HDL Ratio: 2.5 {ratio} (ref 0.0–5.0)
Cholesterol, Total: 102 mg/dL (ref 100–199)
HDL: 41 mg/dL (ref >39)
LDL Chol Calc (NIH): 40 mg/dL (ref 0–99)
Triglycerides: 112 mg/dL (ref 0–149)
VLDL Cholesterol Cal: 21 mg/dL (ref 5–40)

## 2023-08-29 NOTE — Telephone Encounter (Signed)
Spoke with pt. Pt was notified of lab results. Pt will continue current medication and f/u as planned.  

## 2023-10-08 ENCOUNTER — Ambulatory Visit: Payer: Federal, State, Local not specified - PPO | Admitting: Nurse Practitioner

## 2023-10-09 ENCOUNTER — Ambulatory Visit: Payer: Medicare Other | Attending: General Practice | Admitting: Emergency Medicine

## 2023-10-09 ENCOUNTER — Encounter: Payer: Self-pay | Admitting: General Practice

## 2023-10-09 VITALS — BP 134/74 | HR 68 | Ht 66.0 in | Wt 153.0 lb

## 2023-10-09 DIAGNOSIS — I10 Essential (primary) hypertension: Secondary | ICD-10-CM

## 2023-10-09 DIAGNOSIS — I255 Ischemic cardiomyopathy: Secondary | ICD-10-CM

## 2023-10-09 DIAGNOSIS — N1831 Chronic kidney disease, stage 3a: Secondary | ICD-10-CM | POA: Diagnosis present

## 2023-10-09 DIAGNOSIS — I2510 Atherosclerotic heart disease of native coronary artery without angina pectoris: Secondary | ICD-10-CM

## 2023-10-09 DIAGNOSIS — I5020 Unspecified systolic (congestive) heart failure: Secondary | ICD-10-CM | POA: Diagnosis present

## 2023-10-09 DIAGNOSIS — E785 Hyperlipidemia, unspecified: Secondary | ICD-10-CM

## 2023-10-09 MED ORDER — ISOSORBIDE MONONITRATE ER 60 MG PO TB24: 60.0000 mg | ORAL_TABLET | Freq: Every day | ORAL | Status: AC

## 2023-10-09 NOTE — Patient Instructions (Signed)
 Medication Instructions:  The current medical regimen is effective;  continue present plan and medications as directed. Please refer to the Current Medication list given to you today.  *If you need a refill on your cardiac medications before your next appointment, please call your pharmacy*  Lab Work: NONE If you have labs (blood work) drawn today and your tests are completely normal, you will receive your results only by:  MyChart Message (if you have MyChart) OR  A paper copy in the mail If you have any lab test that is abnormal or we need to change your treatment, we will call you to review the results.  Testing/Procedures: NONE  Follow-Up: At [facility], you and your health needs are our priority.  As part of our continuing mission to provide you with exceptional heart care, we have created designated Provider Care Teams.  These Care Teams include your primary Cardiologist (physician) and Advanced Practice Providers (APPs -  Physician Assistants and Nurse Practitioners) who all work together to provide you with the care you need, when you need it.  Your next appointment:   5 month(s)  Provider:   Rollene Rotunda, MD  or  Rise Paganini, DNP         Other Instructions NONE

## 2023-10-09 NOTE — Progress Notes (Signed)
 Cardiology Office Note:    Date:  10/09/2023  ID:  Calvin Marks, DOB 06/15/1936, MRN [id number] PCP: Renford Dills, MD  [facility] Providers Cardiologist:  Rollene Rotunda, MD       Patient Profile:      Calvin Marks is a 88 year old male with visit pertinent history of HTN, severe three-vessel CAD, ischemic cardiomyopathy, chronic HFrEF, hyperlipidemia, CKD stage IIIb  He was admitted to the hospital on 02/21/2023 for chest pain.  Heart catheterization performed on 02/24/2023 showing severe three-vessel CAD and CT surgery was consulted for CABG.  Patient opted for medical management rather than high risk CABG.  Echocardiogram showed newly reduced EF of 35-40% with akinesis of inferolateral wall and basal inferior wall, grade 1 DD, RV SF normal, moderate mitral valve regurgitation.  He was started on Farxiga, statin, Plavix and discharged on 02/27/2023.  He was seen by heart failure clinic on 03/06/2023 where he was doing well at the time.  He was having diarrhea and magnesium Marcelline Deist was stopped.  He was not started on ACE/ARB/Arni/MRA due to CKD and avoiding increasing beta-blocker due to history of bradycardia.  Seen by Dr. Antoine Poche on 04/03/2023 where he was doing remarkably well and Marcelline Deist was restarted at that time.  Again seen by Dr. Antoine Poche on 06/20/2023 where he noted to have some increased blood pressures (148/98).  He was started on hydralazine 10 mg twice daily.  His isosorbide was increased to 60 mg daily.  Increasing his beta-blocker was avoided due to bradycardia.  He was seen in clinic on 08/28/2023.  He was euvolemic and well compensated.  His blood pressure was elevated at 156/82 and repeat 154/76.  He was started on amlodipine 2.5 mg and to follow-up in 1 month for blood pressure check.        History of Present Illness:  Discussed the use of AI scribe software for clinical note transcription with the patient, who gave verbal consent to proceed.  Calvin Marks is a 88 y.o. male who returns for follow-up high blood pressure and coronary artery disease.  He comes into the office today by himself.  He brings in his blood pressure log showing and average blood pressure of 136/76 over the past month.  He does note some anxiety prior to taking his blood pressure which she feels can cause it to become elevated.  He notes since the addition of amlodipine 2.5 mg he has been doing well and is without any cardiovascular concerns or complaints.  He is without any chest pains, shortness of breath, lightheadedness, dizziness, falls, syncope, near syncope.    He maintains an active lifestyle, which includes taking care of and walking his 75 pound [place] and doing household chores.  The patient also has a history of back problems, which cause him constant pain. He reports that his back pain is more bothersome than his heart condition. Despite this, he remains active at home.    Review of Systems  Constitutional: Negative for weight gain and weight loss.  Cardiovascular:  Negative for chest pain, claudication, dyspnea on exertion, irregular heartbeat, leg swelling, near-syncope, orthopnea, palpitations, paroxysmal nocturnal dyspnea and syncope.  Respiratory:  Negative for cough, hemoptysis and shortness of breath.   Gastrointestinal:  Negative for abdominal pain, hematochezia and melena.  Genitourinary:  Negative for hematuria.  Neurological:  Negative for dizziness and light-headedness.     See HPI     Home Medications:    Prior to Admission medications  Medication Sig Start Date End Date Taking? Authorizing Provider  amLODipine (NORVASC) 2.5 MG tablet Take 1 tablet (2.5 mg total) by mouth daily. 08/28/23   Denyce Robert, NP  aspirin 81 MG chewable tablet Chew 1 tablet (81 mg total) by mouth daily. 02/28/23 02/23/24  Dorcas Carrow, MD  clopidogrel (PLAVIX) 75 MG tablet Take 1 tablet (75 mg total) by mouth daily. 06/20/23   Rollene Rotunda, MD   dapagliflozin propanediol (FARXIGA) 10 MG TABS tablet Take 1 tablet (10 mg total) by mouth daily before breakfast. 06/20/23   Rollene Rotunda, MD  finasteride (PROSCAR) 5 MG tablet Take 5 mg by mouth every morning.    [provider]  fish oil-omega-3 fatty acids 1000 MG capsule Take 1 g by mouth every morning.    [provider]  hydrALAZINE (APRESOLINE) 10 MG tablet Take 1 tablet (10 mg total) by mouth 2 (two) times daily. 06/20/23   Rollene Rotunda, MD  HYDROcodone-acetaminophen (NORCO) 7.5-325 MG tablet Take 1 tablet by mouth 4 (four) times daily.    [provider]  isosorbide mononitrate (IMDUR) 60 MG 24 hr tablet Take 1/2 (one-half) tablet by mouth once daily 07/15/23   Rollene Rotunda, MD  Melatonin 10 MG TABS Take 10 mg by mouth at bedtime.    [provider]  metoprolol succinate (TOPROL-XL) 25 MG 24 hr tablet Take 1 tablet (25 mg total) by mouth daily. 06/20/23   Rollene Rotunda, MD  pantoprazole (PROTONIX) 20 MG tablet Take 1 tablet (20 mg total) by mouth daily. 06/20/23   Rollene Rotunda, MD  polyethylene glycol (MIRALAX / GLYCOLAX) packet Take 8.5 g by mouth in the morning.    [provider]  rosuvastatin (CRESTOR) 20 MG tablet Take 1 tablet (20 mg total) by mouth daily. 06/20/23   Rollene Rotunda, MD  traZODone (DESYREL) 100 MG tablet Take 100 mg by mouth at bedtime.    [provider]   Studies Reviewed:   EKG Interpretation Date/Time:  Thursday October 09 2023 14:28:17 EST Ventricular Rate:  68 PR Interval:  174 QRS Duration:  108 QT Interval:  410 QTC Calculation: 435 R Axis:   7  Text Interpretation: Normal sinus rhythm with sinus arrhythmia Possible Anterior infarct , age undetermined Confirmed by Rise Paganini [phone]) on 10/09/2023 3:01:26 PM    Cardiac Catheterization 02/24/2023   Prox RCA lesion is 70% stenosed.   RPDA lesion is 100% stenosed.   Ost LM lesion is 40% stenosed.   Ost LAD to Prox LAD lesion is 90%  stenosed.   Mid LAD lesion is 95% stenosed.   1st Diag-1 lesion is 95% stenosed.   1st Diag-2 lesion is 90% stenosed.   Ost Cx to Prox Cx lesion is 80% stenosed.   Prox Cx to Mid Cx lesion is 95% stenosed.   1.  Severe multivessel disease. 2.  LVEDP of 17 mmHg. Diagnostic Dominance: Right   Echocardiogram 02/22/2023 1. Akinesis of the inferolateral wall and basal inferior wall; overall  moderate LV dysfunction.   2. Left ventricular ejection fraction, by estimation, is 35 to 40%. The  left ventricle has moderately decreased function. The left ventricle  demonstrates regional wall motion abnormalities (see scoring  diagram/findings for description). Left ventricular   diastolic parameters are consistent with Grade I diastolic dysfunction  (impaired relaxation).   3. Right ventricular systolic function is normal. The right ventricular  size is normal.   4. The mitral valve is normal in structure. Moderate mitral valve  regurgitation.  No evidence of mitral stenosis.   5. The aortic valve is tricuspid. Aortic valve regurgitation is trivial.  Aortic valve sclerosis is present, with no evidence of aortic valve  stenosis.   6. The inferior vena cava is normal in size with greater than 50%  respiratory variability, suggesting right atrial pressure of 3 mmHg.  Risk Assessment/Calculations:             Physical Exam:   VS:  BP 134/74 (BP Location: Right Arm, Patient Position: Sitting, Cuff Size: Normal)   Pulse 68   Ht 5\' 6"  (1.676 m)   Wt 153 lb (69.4 kg)   SpO2 97%   BMI 24.69 kg/m    Wt Readings from Last 3 Encounters:  10/09/23 153 lb (69.4 kg)  08/28/23 157 lb (71.2 kg)  06/20/23 150 lb 3.2 oz (68.1 kg)    Constitutional:      Appearance: Normal and healthy appearance. Not in distress.  Neck:     Vascular: JVD normal.  Pulmonary:     Effort: Pulmonary effort is normal.     Breath sounds: Normal breath sounds.  Chest:     Chest wall: Not tender to palpatation.   Cardiovascular:     PMI at left midclavicular line. Normal rate. Regular rhythm. Normal S1. Normal S2.      Murmurs: There is a grade 3/6 systolic murmur.     No gallop.  No click. No rub.  Pulses:    Intact distal pulses.  Edema:    Peripheral edema absent.  Musculoskeletal: Normal range of motion.     Cervical back: Normal range of motion and neck supple. Skin:    General: Skin is warm and dry.  Neurological:     General: No focal deficit present.     Mental Status: Alert, oriented to person, place, and time and oriented to person, place and time.  Psychiatric:        Mood and Affect: Mood and affect normal.        Behavior: Behavior is cooperative.        Thought Content: Thought content normal.        Assessment and Plan:  Coronary artery disease  LHC 02/2023 showed severe 3-vessel CAD not amenable to PCI.  CT surgery consulted on 02/25/2023 who had discussion with patient for CABG.  Patient declined CABG during his admission and prefers for medical management after informed decision.  -He is stable with no anginal symptoms, no indication for ischemic evaluation at this time.  -EKG today shows NSR with no ST/T wave changes -He continues to be on DAPT with Aspirin and Plavix.  Will have patient follow-up in in 5 months which is 1 year post NSTEMI with primary cardiologist who can make decision on whether to continue DAPT at that time -Continue Aspirin 81mg  daily, Plavix 75mg  daily, Imdur 60mg  daily, Metoprolol XL 25mg  daily, Rosuvastatin 20mg  daily    Chronic HFrEF / Ischemic cardiomyopathy Echo 02/2023 with EF 35-40% in setting of NSTEMI, newly reduced from prior normal EF in 2018  History of severe multivessel CAD. CABG deferred as noted above.  GDMT limited by CKD, no addition of ACE/ARB/ARNI/MRA. Does not require loop diuretic -Today he is euvolemic and well compensated.  NYHA class II.  He continues to remain active and overall asymptomatic with no overt heart failure  symptoms -He politely deferred repeat echocardiogram at this time  -Conitnue GDMT Farixga 10mg  daily, Imdur 60mg  daily, Metoprolol-XL 25mg  daily, Hydaralizine 10mg  daily  Hypertension  BP today 134/74 and under good control.  Blood pressure has improved since addition of amlodipine.  His average blood pressure at home is 136/76.  He notes he will have some occasional high readings at home which he relates to his anxiety -Continue monitoring BP at home -Continue current medication regimen consisting of hydralazine 10 mg twice daily, Imdur 60 mg daily, metoprolol XL 25 mg daily, amlodipine 2.5 mg daily  CKD stage 3a Creatinine 1.42 and GFR 48 on 08/2023 Remains stable.  No change in therapy   Hyperlipidemia LDL 40, TG 112, HDL 41 on 02/8294 LDL under excellent control -Continue rosuvastatin 20 mg daily            Dispo:  Return in about 5 months (around 03/07/2024).  Signed, Denyce Robert, NP

## 2023-12-10 ENCOUNTER — Other Ambulatory Visit: Payer: Self-pay | Admitting: Internal Medicine

## 2023-12-10 DIAGNOSIS — R0989 Other specified symptoms and signs involving the circulatory and respiratory systems: Secondary | ICD-10-CM

## 2024-01-07 ENCOUNTER — Ambulatory Visit
Admission: RE | Admit: 2024-01-07 | Discharge: 2024-01-07 | Disposition: A | Discharge status: Home / Self Care | Source: Ambulatory Visit | Attending: Internal Medicine | Admitting: Internal Medicine

## 2024-01-07 ENCOUNTER — Other Ambulatory Visit: Payer: Self-pay | Admitting: Internal Medicine

## 2024-01-07 DIAGNOSIS — R0989 Other specified symptoms and signs involving the circulatory and respiratory systems: Secondary | ICD-10-CM

## 2024-04-08 ENCOUNTER — Telehealth: Payer: Self-pay | Admitting: Pharmacy Technician

## 2024-04-08 ENCOUNTER — Other Ambulatory Visit (HOSPITAL_COMMUNITY): Payer: Self-pay

## 2024-04-08 NOTE — Telephone Encounter (Signed)
 Pharmacy Patient Advocate Encounter   Received notification from Fax that prior authorization for Farxiga  10 mg is required/requested.   Insurance verification completed.   The patient is insured through [employer] .   Per test claim: PA required; PA submitted to above mentioned insurance via Latent Key/confirmation #/EOC [id number] Status is pending

## 2024-04-08 NOTE — Telephone Encounter (Signed)
 Pharmacy Patient Advocate Encounter  Received notification from [facility] that Prior Authorization for Farxiga  Calvin Marks has been APPROVED from 04/08/24 to 04/08/25. Ran test claim, Copay is $100.57- 3 months. This test claim was processed through [facility]- copay amounts may vary at other pharmacies due to pharmacy/plan contracts, or as the patient moves through the different stages of their insurance plan.   PA #/Case ID/Reference #: [id number]   Generic is $223.02 for 3 months

## 2024-06-05 ENCOUNTER — Other Ambulatory Visit: Payer: Self-pay | Admitting: Cardiology

## 2024-06-05 DIAGNOSIS — I10 Essential (primary) hypertension: Secondary | ICD-10-CM

## 2024-06-05 DIAGNOSIS — I5021 Acute systolic (congestive) heart failure: Secondary | ICD-10-CM

## 2024-06-15 ENCOUNTER — Telehealth: Payer: Self-pay | Admitting: Pharmacy Technician

## 2024-06-15 ENCOUNTER — Other Ambulatory Visit (HOSPITAL_COMMUNITY): Payer: Self-pay

## 2024-06-15 NOTE — Telephone Encounter (Signed)
   Pharmacy Patient Advocate Encounter   Received notification from Onbase that prior authorization for pantoprazole  is required/requested.   Insurance verification completed.   The patient is insured through [facility].   Per test claim: PA required; PA submitted to above mentioned insurance via Latent Key/confirmation #/EOC [id number] Status is pending

## 2024-06-15 NOTE — Telephone Encounter (Signed)
 Pharmacy Patient Advocate Encounter  Received notification from [facility] that Prior Authorization for pantoprazole  has been APPROVED from 06/15/24 to 06/15/25   PA #/Case ID/Reference #: [id number]

## 2024-07-05 ENCOUNTER — Other Ambulatory Visit: Payer: Self-pay | Admitting: Cardiology

## 2024-09-01 ENCOUNTER — Other Ambulatory Visit: Payer: Self-pay | Admitting: Cardiology

## 2024-09-01 DIAGNOSIS — I10 Essential (primary) hypertension: Secondary | ICD-10-CM

## 2024-09-01 DIAGNOSIS — I5021 Acute systolic (congestive) heart failure: Secondary | ICD-10-CM

## 2024-09-01 NOTE — Telephone Encounter (Signed)
 Pt must schedule a followup appt with Cardiology in February 2026 for any more refills.    2nd attempt  [phone] Thank You

## 2024-09-21 ENCOUNTER — Other Ambulatory Visit: Payer: Self-pay | Admitting: Cardiology

## 2024-09-23 NOTE — Telephone Encounter (Signed)
 In accordance with refill protocols, please review and address the following requirements before this medication refill can be authorized:  Labs  Pt needs labs within 365 days within normal range
